# Patient Record
Sex: Male | Born: 1960 | Race: White | Hispanic: No | Marital: Single | Smoking: Current every day smoker
Health system: Southern US, Community
[De-identification: ages and names within clinical notes are randomized; demographics above are authoritative.]

## PROBLEM LIST (undated history)

## (undated) DIAGNOSIS — G709 Myoneural disorder, unspecified: Secondary | ICD-10-CM

## (undated) DIAGNOSIS — Z87442 Personal history of urinary calculi: Secondary | ICD-10-CM

## (undated) DIAGNOSIS — B1910 Unspecified viral hepatitis B without hepatic coma: Secondary | ICD-10-CM

## (undated) HISTORY — PX: TONSILLECTOMY: SUR1361

## (undated) HISTORY — PX: COLONOSCOPY: SHX174

## (undated) HISTORY — DX: Unspecified viral hepatitis B without hepatic coma: B19.10

## (undated) HISTORY — PX: TYMPANOSTOMY TUBE PLACEMENT: SHX32

## (undated) HISTORY — PX: TONSILLECTOMY AND ADENOIDECTOMY: SUR1326

---
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
## 2003-11-01 DIAGNOSIS — B1910 Unspecified viral hepatitis B without hepatic coma: Secondary | ICD-10-CM

## 2003-11-01 HISTORY — DX: Unspecified viral hepatitis B without hepatic coma: B19.10

## 2007-04-13 ENCOUNTER — Emergency Department (HOSPITAL_COMMUNITY): Admission: EM | Admit: 2007-04-13 | Discharge: 2007-04-14 | Payer: Self-pay | Admitting: Emergency Medicine

## 2008-04-25 IMAGING — CT CT ABDOMEN W/O CM
2 of 4 series · 17 of 46 positions shown, 19 images · non-contrast
Comparison: none

CLINICAL DATA: Left flank pain

ABDOMEN CT WITHOUT CONTRAST - URINARY STONE PROTOCOL
TECHNIQUE: Multidetector CT imaging of the abdomen was performed following the
urinary stone protocol.  No oral or intravenous contrast was administered.
TECHNIQUE: Multidetector CT imaging of the pelvis was performed following the

[Series 2: abd/pelv w/o 5.0 b31f st · axial · non-contrast · 0.66mm/px · z∈[-432,-32]mm · 14 of 88 slices shown, 16 images]
[im 4/88  soft-tissue]
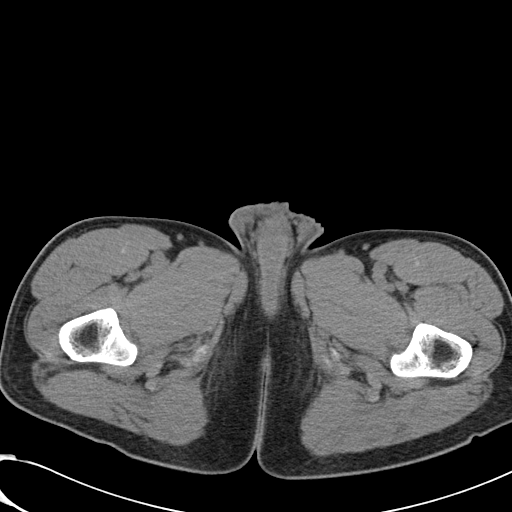
[im 4/88  bone]
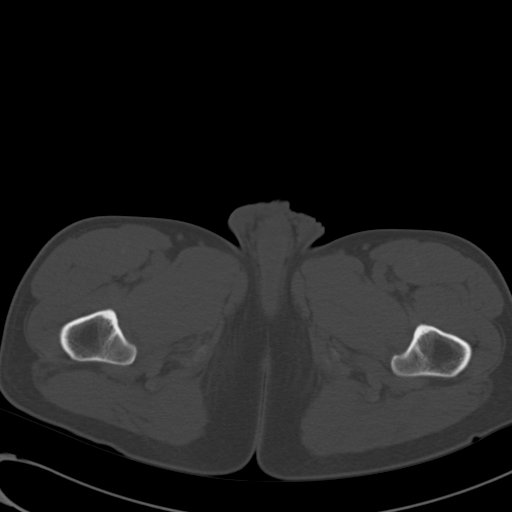
[im 11/88  soft-tissue]
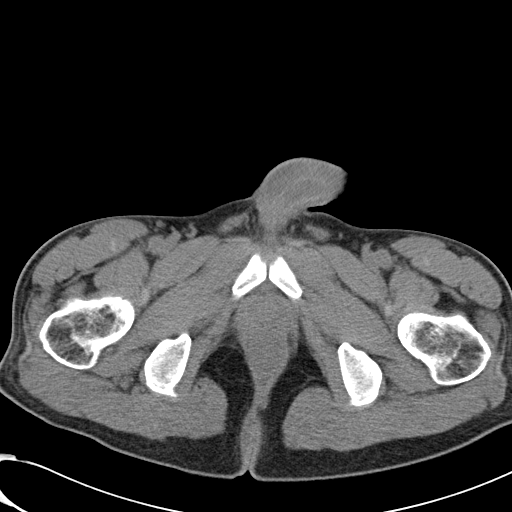
[im 19/88  soft-tissue]
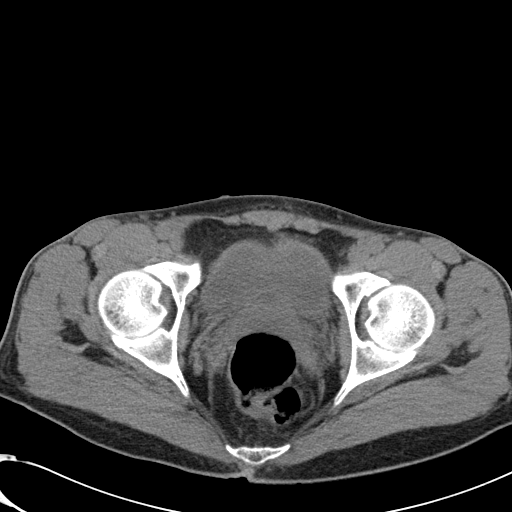
[im 22/88  soft-tissue]
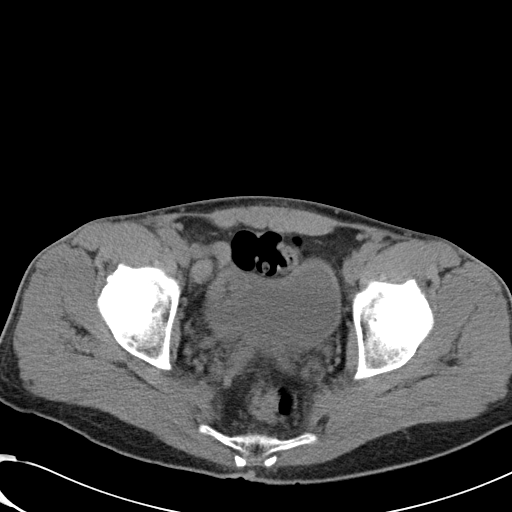
[im 30/88  soft-tissue]
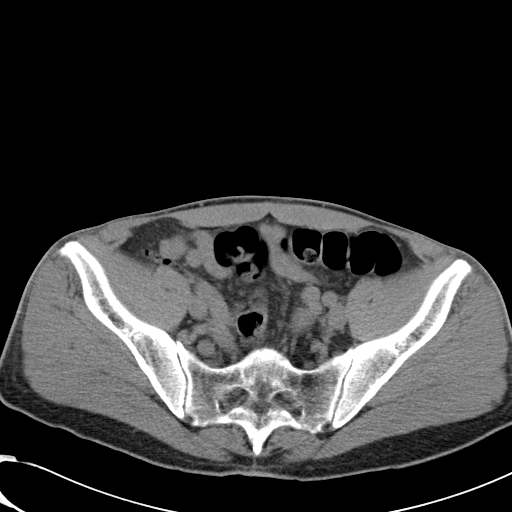
[im 37/88  soft-tissue]
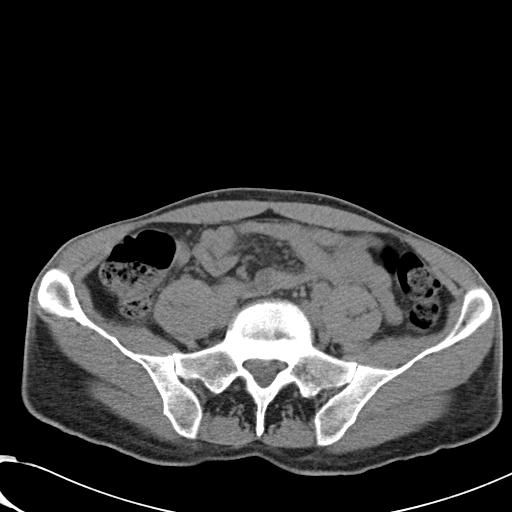
[im 40/88  soft-tissue]
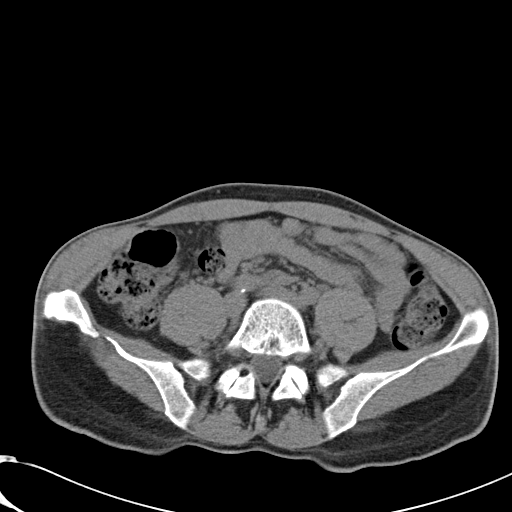
[im 48/88  soft-tissue]
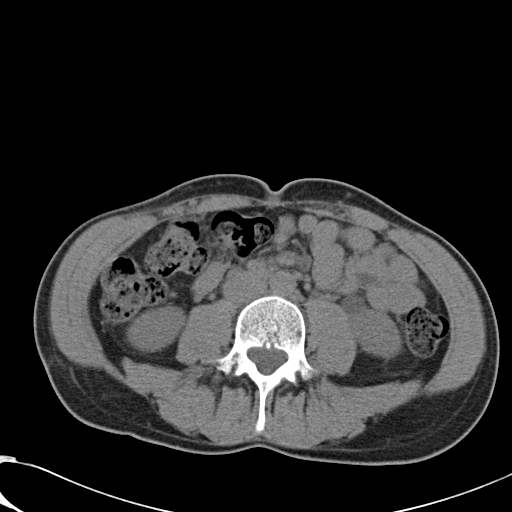
[im 51/88  soft-tissue]
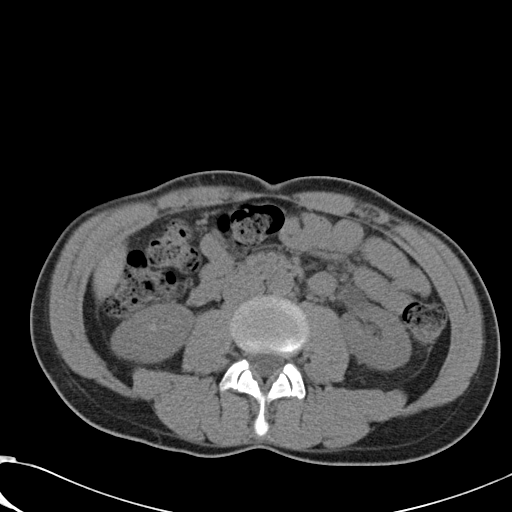
[im 51/88  bone]
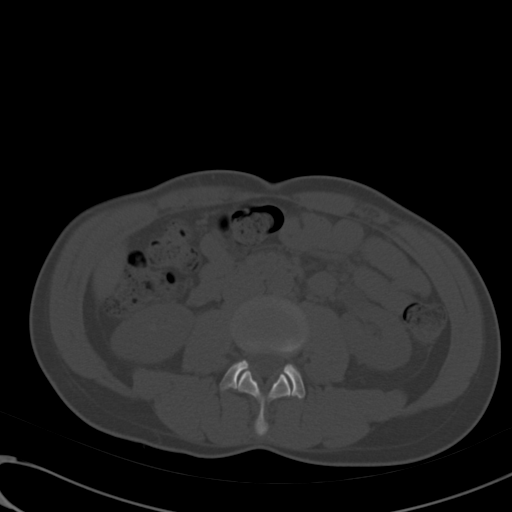
[im 59/88  soft-tissue]
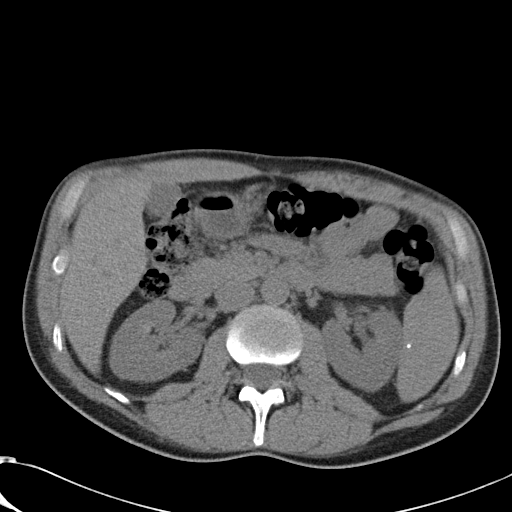
[im 66/88  soft-tissue]
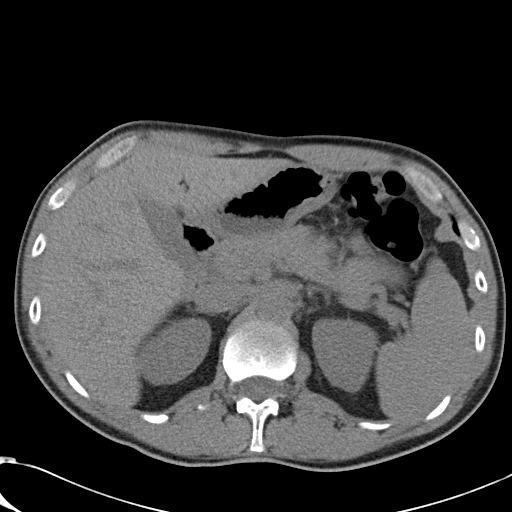
[im 69/88  soft-tissue]
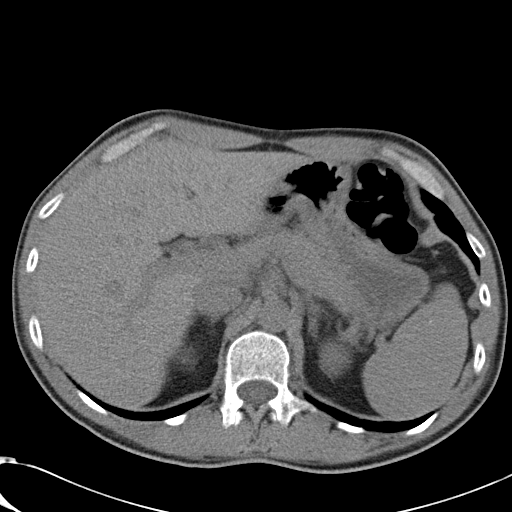
[im 77/88  soft-tissue]
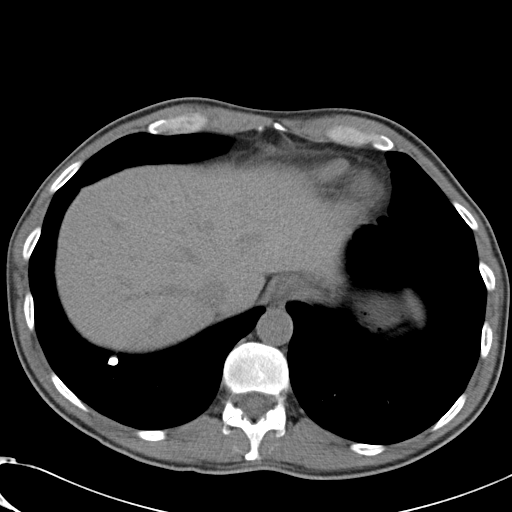
[im 84/88  soft-tissue]
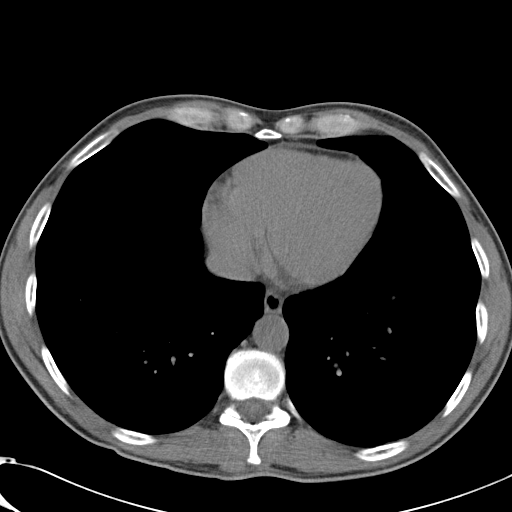

[Series 602: cor · coronal · 0.86mm/px · 3 of 89 slices shown]
[im 30/89  soft-tissue]
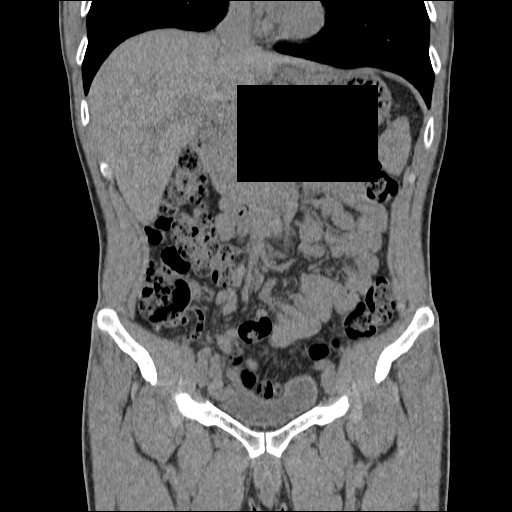
[im 40/89  soft-tissue]
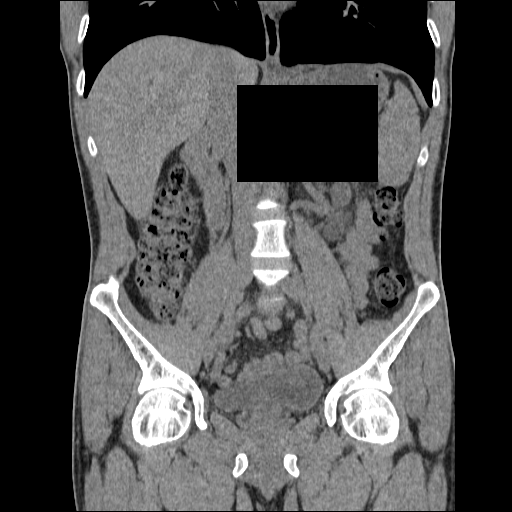
[im 49/89  soft-tissue]
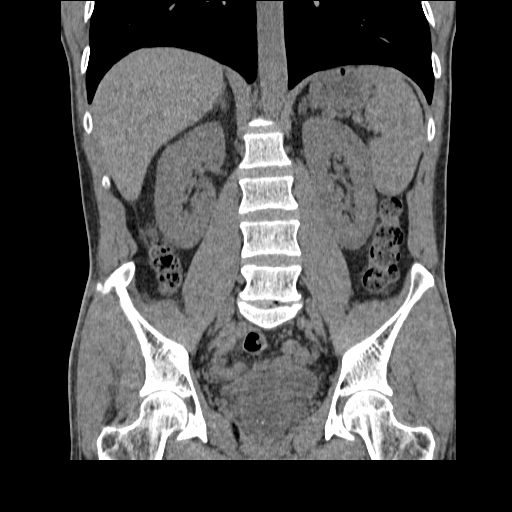

[17 of 46 positions shown; findings below may reference images not displayed]

FINDINGS: There is mild left hydronephrosis. 3 mm left UPJ stone present. No
additional stones. No hydronephrosis on the right. Small calcification is noted
in the spleen compatible with old granulomatous disease. Solid organs otherwise
have an unremarkable unenhanced appearance. Gallbladder and bowel grossly
unremarkable. Lung bases are clear except for calcified granuloma in the right
lung base.

IMPRESSION

3 mm left UPJ stone with mild left hydronephrosis.

PELVIS CT WITHOUT CONTRAST - URINARY STONE PROTOCOL
FINDINGS: No additional ureteral stones. Ureters are decompressed. Bladder
unremarkable. Mildly prominent prostate with calcifications noted. Appendix is
visualized and is normal. Bowel grossly unremarkable. No acute bony abnormality.

IMPRESSION

No acute findings in the pelvis.

Mildly prominent prostate.

## 2010-11-17 ENCOUNTER — Encounter: Payer: Self-pay | Admitting: Internal Medicine

## 2010-11-17 ENCOUNTER — Ambulatory Visit
Admission: RE | Admit: 2010-11-17 | Discharge: 2010-11-17 | Payer: Self-pay | Source: Home / Self Care | Attending: Internal Medicine | Admitting: Internal Medicine

## 2010-11-17 ENCOUNTER — Other Ambulatory Visit: Payer: Self-pay | Admitting: Internal Medicine

## 2010-11-17 DIAGNOSIS — F17200 Nicotine dependence, unspecified, uncomplicated: Secondary | ICD-10-CM | POA: Insufficient documentation

## 2010-11-17 DIAGNOSIS — B1910 Unspecified viral hepatitis B without hepatic coma: Secondary | ICD-10-CM | POA: Insufficient documentation

## 2010-11-17 LAB — CBC WITH DIFFERENTIAL/PLATELET
Basophils Absolute: 0 10*3/uL (ref 0.0–0.1)
Basophils Relative: 0.4 % (ref 0.0–3.0)
Eosinophils Absolute: 0.2 10*3/uL (ref 0.0–0.7)
Eosinophils Relative: 2.5 % (ref 0.0–5.0)
HCT: 46.1 % (ref 39.0–52.0)
Hemoglobin: 15.7 g/dL (ref 13.0–17.0)
Lymphocytes Relative: 36.7 % (ref 12.0–46.0)
Lymphs Abs: 2.9 10*3/uL (ref 0.7–4.0)
MCHC: 34.1 g/dL (ref 30.0–36.0)
MCV: 91.8 fl (ref 78.0–100.0)
Monocytes Absolute: 0.6 10*3/uL (ref 0.1–1.0)
Monocytes Relative: 7.5 % (ref 3.0–12.0)
Neutro Abs: 4.1 10*3/uL (ref 1.4–7.7)
Neutrophils Relative %: 52.9 % (ref 43.0–77.0)
Platelets: 228 10*3/uL (ref 150.0–400.0)
RBC: 5.02 Mil/uL (ref 4.22–5.81)
RDW: 12.8 % (ref 11.5–14.6)
WBC: 7.8 10*3/uL (ref 4.5–10.5)

## 2010-11-17 LAB — HEPATIC FUNCTION PANEL
ALT: 17 U/L (ref 0–53)
AST: 22 U/L (ref 0–37)
Albumin: 4.4 g/dL (ref 3.5–5.2)
Alkaline Phosphatase: 88 U/L (ref 39–117)
Bilirubin, Direct: 0.2 mg/dL (ref 0.0–0.3)
Total Bilirubin: 1 mg/dL (ref 0.3–1.2)
Total Protein: 7 g/dL (ref 6.0–8.3)

## 2010-11-17 LAB — BASIC METABOLIC PANEL
BUN: 13 mg/dL (ref 6–23)
CO2: 29 mEq/L (ref 19–32)
Calcium: 10 mg/dL (ref 8.4–10.5)
Chloride: 105 mEq/L (ref 96–112)
Creatinine, Ser: 0.9 mg/dL (ref 0.4–1.5)
GFR: 98.04 mL/min (ref >60.00)
Glucose, Bld: 81 mg/dL (ref 70–99)
Potassium: 3.9 mEq/L (ref 3.5–5.1)
Sodium: 141 mEq/L (ref 135–145)

## 2010-11-17 LAB — CONVERTED CEMR LAB
Hep A Total Ab: NEGATIVE
Hepatitis B Surface Ag: NEGATIVE

## 2010-11-17 LAB — LIPID PANEL
Cholesterol: 190 mg/dL (ref 0–200)
HDL: 38.8 mg/dL — ABNORMAL LOW (ref >39.00)
LDL Cholesterol: 135 mg/dL — ABNORMAL HIGH (ref 0–99)
Total CHOL/HDL Ratio: 5
Triglycerides: 79 mg/dL (ref 0.0–149.0)
VLDL: 15.8 mg/dL (ref 0.0–40.0)

## 2010-11-17 LAB — PSA: PSA: 0.42 ng/mL (ref 0.10–4.00)

## 2010-11-17 LAB — TSH: TSH: 1.62 u[IU]/mL (ref 0.35–5.50)

## 2010-12-02 NOTE — Letter (Signed)
Summary: Lipid Letter  [facility]  [address] [place], [place] [address]   Phone: [phone]  Fax: [phone]    11/17/2010  [facility] [address] [place], [place]  [address]  Dear Daniel Wells:  We have carefully reviewed your last lipid profile from 11/17/2010 and the results are noted below with a summary of recommendations for lipid management.    Cholesterol:       190     Goal: <200   HDL "good" Cholesterol:   41.32     Goal: >40   LDL "bad" Cholesterol:   135     Goal: <130   Triglycerides:       79.0     Goal: <150    all of your labs look good    TLC Diet (Therapeutic Lifestyle Change): Saturated Fats & Transfatty acids should be kept < 7% of total calories ***Reduce Saturated Fats Polyunstaurated Fat can be up to 10% of total calories Monounsaturated Fat Fat can be up to 20% of total calories Total Fat should be no greater than 25-35% of total calories Carbohydrates should be 50-60% of total calories Protein should be approximately 15% of total calories Fiber should be at least 20-30 grams a day ***Increased fiber may help lower LDL Total Cholesterol should be < 200mg /day Consider adding plant stanol/sterols to diet (example: Benacol spread) ***A higher intake of unsaturated fat may reduce Triglycerides and Increase HDL    Adjunctive Measures (may lower LIPIDS and reduce risk of Heart Attack) include: Aerobic Exercise (20-30 minutes 3-4 times a week) Limit Alcohol Consumption Weight Reduction Aspirin 75-81 mg a day by mouth (if not allergic or contraindicated) Dietary Fiber 20-30 grams a day by mouth     Current Medications:  None If you have any questions, please call. We appreciate being able to work with you.   Sincerely,    [facility] Etta Grandchild MD

## 2010-12-02 NOTE — Assessment & Plan Note (Signed)
Summary: NEW PT CPX/UMR/#/LB   Vital Signs:  Patient profile:   50 year old male Height:      70 inches Weight:      147 pounds BMI:     21.17 O2 Sat:      97 % on Room air Temp:     98.1 degrees F oral Pulse rate:   61 / minute Pulse rhythm:   regular Resp:     16 per minute BP sitting:   138 / 80  (left arm) Cuff size:   large  Vitals Entered By: Rock Nephew CMA (November 17, 2010 10:47 AM)  O2 Flow:  Room air CC: new to establish, Preventive Care Is Patient Diabetic? No Pain Assessment Patient in pain? no       Does patient need assistance? Functional Status Self care Ambulation Normal   Primary Care Provider:  Etta Grandchild MD  CC:  new to establish and Preventive Care.  History of Present Illness: New to me for a complete physical. He fells well today with no complaints.  Preventive Screening-Counseling & Management  Alcohol-Tobacco     Alcohol drinks/day: 0     Alcohol Counseling: not indicated; patient does not drink     Smoking Status: current     Smoking Cessation Counseling: yes     Smoke Cessation Stage: precontemplative     Packs/Day: 1.5     Year Started: 1976     Pack years: 24     Tobacco Counseling: to quit use of tobacco products  Caffeine-Diet-Exercise     Does Patient Exercise: no  Hep-HIV-STD-Contraception     Hepatitis Risk: no risk noted     HIV Risk: no risk noted     STD Risk: no risk noted     Dental Visit-last 6 months no     Dental Care Counseling: to seek dental care; no dental care within six months     TSE monthly: yes     Testicular SE Education/Counseling to perform regular STE     Sun Exposure-Excessive: no     Sun Exposure Counseling: not indicated; sun exposure is acceptable  Safety-Violence-Falls     Seat Belt Use: yes     Helmet Use: n/a     Firearms in the Home: no firearms in the home     Smoke Detectors: yes     Violence in the Home: no risk noted      Sexual History:  not active.        Drug Use:   no.        Blood Transfusions:  no.    Current Medications (verified): 1)  None  Allergies (verified): No Known Drug Allergies  Past History:  Past Medical History: Hepatitis B in 2005 after living in the [place] Right hip fracture in 2002 after a fall  Past Surgical History: Tonsillectomy  Family History: adopted  Social History: Occupation: Curator Divorced Current Smoker Alcohol use-no Drug use-no Regular exercise-no Smoking Status:  current Drug Use:  no Does Patient Exercise:  no Packs/Day:  1.5 Hepatitis Risk:  no risk noted HIV Risk:  no risk noted STD Risk:  no risk noted Dental Care w/in 6 mos.:  no Sun Exposure-Excessive:  no Seat Belt Use:  yes Blood Transfusions:  no Sexual History:  not active  Review of Systems  The patient denies anorexia, fever, weight loss, weight gain, hoarseness, chest pain, syncope, dyspnea on exertion, peripheral edema, prolonged cough, headaches, hemoptysis, abdominal pain,  melena, hematochezia, severe indigestion/heartburn, hematuria, suspicious skin lesions, transient blindness, difficulty walking, depression, enlarged lymph nodes, angioedema, and testicular masses.   GI:  Denies abdominal pain, change in bowel habits, constipation, diarrhea, indigestion, loss of appetite, nausea, vomiting, vomiting blood, and yellowish skin color.  Physical Exam  General:  alert, well-developed, well-nourished, well-hydrated, appropriate dress, cooperative to examination, good hygiene, and underweight appearing.   Head:  normocephalic and atraumatic.   Eyes:  No corneal or conjunctival inflammation noted. EOMI. Perrla. Funduscopic exam benign, without hemorrhages, exudates or papilledema. Vision grossly normal. no icterus. Ears:  R ear normal and L ear normal.   Nose:  External nasal examination shows no deformity or inflammation. Nasal mucosa are pink and moist without lesions or exudates. Mouth:  pharynx pink and moist, no erythema,  no exudates, no posterior lymphoid hypertrophy, no postnasal drip, no pharyngeal crowing, no lesions, no aphthous ulcers, no erosions, no tongue abnormalities, no leukoplakia, no petechiae, poor dentition, teeth missing, and excessive plaque.   Neck:  supple, full ROM, no masses, no thyromegaly, no thyroid nodules or tenderness, no JVD, no HJR, normal carotid upstroke, and no carotid bruits.   Chest Wall:  No deformities, masses, tenderness or gynecomastia noted. Breasts:  No masses or gynecomastia noted Lungs:  Normal respiratory effort, chest expands symmetrically. Lungs are clear to auscultation, no crackles or wheezes. Heart:  Normal rate and regular rhythm. S1 and S2 normal without gallop, murmur, click, rub or other extra sounds. Abdomen:  soft, non-tender, normal bowel sounds, no distention, no masses, no guarding, no rigidity, no rebound tenderness, no abdominal hernia, no inguinal hernia, no hepatomegaly, and no splenomegaly.   Rectal:  No external abnormalities noted. Normal sphincter tone. No rectal masses or tenderness. Genitalia:  circumcised, no hydrocele, no varicocele, no scrotal masses, no testicular masses or atrophy, no cutaneous lesions, and no urethral discharge.   Prostate:  Prostate gland firm and smooth, no enlargement, nodularity, tenderness, mass, asymmetry or induration. Msk:  No deformity or scoliosis noted of thoracic or lumbar spine.   Pulses:  R and L carotid,radial,femoral,dorsalis pedis and posterior tibial pulses are full and equal bilaterally Extremities:  No clubbing, cyanosis, edema, or deformity noted with normal full range of motion of all joints.   Neurologic:  No cranial nerve deficits noted. Station and gait are normal. Plantar reflexes are down-going bilaterally. DTRs are symmetrical throughout. Sensory, motor and coordinative functions appear intact. Skin:  skin is dry with a rare patch of eczema but no abnormal lesions Cervical Nodes:  no anterior cervical  adenopathy and no posterior cervical adenopathy.   Axillary Nodes:  no R axillary adenopathy and no L axillary adenopathy.   Inguinal Nodes:  no R inguinal adenopathy and no L inguinal adenopathy.   Psych:  Cognition and judgment appear intact. Alert and cooperative with normal attention span and concentration. No apparent delusions, illusions, hallucinations   Impression & Recommendations:  Problem # 1:  HEPATITIS B (ICD-070.30) Assessment New  Orders: Venipuncture ([address]) TLB-Lipid Panel (80061-LIPID) TLB-BMP (Basic Metabolic Panel-BMET) (80048-METABOL) TLB-CBC Platelet - w/Differential (85025-CBCD) TLB-Hepatic/Liver Function Pnl (80076-HEPATIC) TLB-TSH (Thyroid Stimulating Hormone) (84443-TSH) TLB-PSA (Prostate Specific Antigen) (84153-PSA) T-Hepatitis C Antibody (60454-09811) T-Hepatitis B Surface Antigen (91478-29562) T-Hepatitis A Antibody (13086-57846)  Problem # 2:  ROUTINE GENERAL MEDICAL EXAM@HEALTH  CARE FACL (ICD-V70.0)  Orders: Venipuncture ([address]) TLB-Lipid Panel (80061-LIPID) TLB-BMP (Basic Metabolic Panel-BMET) (80048-METABOL) TLB-CBC Platelet - w/Differential (85025-CBCD) TLB-Hepatic/Liver Function Pnl (80076-HEPATIC) TLB-TSH (Thyroid Stimulating Hormone) (84443-TSH) TLB-PSA (Prostate Specific Antigen) (84153-PSA) T-Hepatitis C Antibody (28413-24401)  T-Hepatitis B Surface Antigen ([phone]) T-Hepatitis A Antibody (14782-95621) Gastroenterology Referral (GI) Hemoccult Guaiac-1 spec.(in office) [phone])  Discussed using sunscreen, use of alcohol, drug use, self testicular exam, routine dental care, routine eye care, routine physical exam, seat belts, multiple vitamins, osteoporosis prevention, adequate calcium intake in diet, and recommendations for immunizations.  Discussed exercise and checking cholesterol.  Discussed gun safety, safe sex, and contraception. Also recommend checking PSA.  Problem # 3:  TOBACCO USE (ICD-305.1) Assessment:  Unchanged  Encouraged smoking cessation and discussed different methods for smoking cessation.   Colorectal Screening:  Current Recommendations:    Hemoccult: NEG X 1 today    Colonoscopy recommended: scheduled with G.I.  PSA Screening:    Reviewed PSA screening recommendations: PSA ordered  Patient Instructions: 1)  Please schedule a follow-up appointment as needed. 2)  Tobacco is very bad for your health and your loved ones! You Should stop smoking!. 3)  Stop Smoking Tips: Choose a Quit date. Cut down before the Quit date. decide what you will do as a substitute when you feel the urge to smoke(gum,toothpick,exercise). 4)  Schedule a colonoscopy/sigmoidoscopy to help detect colon cancer. 5)  If you could be exposed to sexually transmitted diseases, you should use a condom.   Orders Added: 1)  Venipuncture [36415] 2)  TLB-Lipid Panel [80061-LIPID] 3)  TLB-BMP (Basic Metabolic Panel-BMET) [80048-METABOL] 4)  TLB-CBC Platelet - w/Differential [85025-CBCD] 5)  TLB-Hepatic/Liver Function Pnl [80076-HEPATIC] 6)  TLB-TSH (Thyroid Stimulating Hormone) [84443-TSH] 7)  TLB-PSA (Prostate Specific Antigen) [78469-GEX] 8)  T-Hepatitis C Antibody [52841-32440] 9)  T-Hepatitis B Surface Antigen [10272-53664] 10)  T-Hepatitis A Antibody [40347-42595] 11)  Gastroenterology Referral [GI] 12)  Hemoccult Guaiac-1 spec.(in office) [82270] 13)  New Patient 40-64 years [99386]    Prevention & Chronic Care Immunizations   Influenza vaccine: Not documented   Influenza vaccine deferral: Refused  (11/17/2010)    Tetanus booster: Not documented   Td booster deferral: Refused  (11/17/2010)    Pneumococcal vaccine: Not documented  Colorectal Screening   Hemoccult: Not documented   Hemoccult action/deferral: NEG X 1 today  (11/17/2010)    Colonoscopy: Not documented   Colonoscopy action/deferral: scheduled with G.I.  (11/17/2010)  Other Screening   PSA: Not documented   PSA ordered.   PSA  action/deferral: PSA ordered  (11/17/2010)   Smoking status: current  (11/17/2010)   Smoking cessation counseling: yes  (11/17/2010)  Lipids   Total Cholesterol: Not documented   LDL: Not documented   LDL Direct: Not documented   HDL: Not documented   Triglycerides: Not documented

## 2010-12-10 ENCOUNTER — Encounter (INDEPENDENT_AMBULATORY_CARE_PROVIDER_SITE_OTHER): Payer: Self-pay | Admitting: *Deleted

## 2010-12-16 NOTE — Letter (Signed)
Summary: Pre Visit Letter Revised  [facility]  [address] [place], [place] [address]   Phone: [phone]  Fax: [phone]        12/10/2010 MRN: [id number] [name] [address] [place], [place]  [address]             Procedure Date:  01/05/2011 @ 10:30   Direct colon-Dr. Marina Goodell   Welcome to the Gastroenterology Division at [facility].    You are scheduled to see a nurse for your pre-procedure visit on 12/22/2010 at 8:30 on the 3rd floor at [facility], [address]  We ask that you try to arrive at our office 15 minutes prior to your appointment time to allow for check-in.  Please take a minute to review the attached form.  If you answer "Yes" to one or more of the questions on the first page, we ask that you call the person listed at your earliest opportunity.  If you answer "No" to all of the questions, please complete the rest of the form and bring it to your appointment.    Your nurse visit will consist of discussing your medical and surgical history, your immediate family medical history, and your medications.   If you are unable to list all of your medications on the form, please bring the medication bottles to your appointment and we will list them.  We will need to be aware of both prescribed and over the counter drugs.  We will need to know exact dosage information as well.    Please be prepared to read and sign documents such as consent forms, a financial agreement, and acknowledgement forms.  If necessary, and with your consent, a friend or relative is welcome to sit-in on the nurse visit with you.  Please bring your insurance card so that we may make a copy of it.  If your insurance requires a referral to see a specialist, please bring your referral form from your primary care physician.  No co-pay is required for this nurse visit.     If you cannot keep your appointment, please call [phone] to cancel or reschedule prior to your  appointment date.  This allows [place] the opportunity to schedule an appointment for another patient in need of care.    Thank you for choosing [facility] for your medical needs.  We appreciate the opportunity to care for you.  Please visit [place] at our website  to learn more about our practice.  Sincerely, The Gastroenterology Division

## 2010-12-21 ENCOUNTER — Encounter (INDEPENDENT_AMBULATORY_CARE_PROVIDER_SITE_OTHER): Payer: Self-pay | Admitting: *Deleted

## 2010-12-22 ENCOUNTER — Encounter: Payer: Self-pay | Admitting: Internal Medicine

## 2010-12-28 NOTE — Miscellaneous (Signed)
Summary: [facility] Previsit/prep  Clinical Lists Changes  Medications: Added new medication of MOVIPREP 100 GM  SOLR (PEG-KCL-NACL-NASULF-NA ASC-C) As per prep instructions. - Signed Rx of MOVIPREP 100 GM  SOLR (PEG-KCL-NACL-NASULF-NA ASC-C) As per prep instructions.;  #1 x 0;  Signed;  Entered by: [name] RN;  Authorized by: Hilarie Fredrickson MD;  Method used: Electronically to [facility] [phone]*, [address] [place], [place]  [address], Ph: [phone] or [phone], Fax: [phone] Observations: Added new observation of NKA: T (12/22/2010 7:55)    Prescriptions: MOVIPREP 100 GM  SOLR (PEG-KCL-NACL-NASULF-NA ASC-C) As per prep instructions.  #1 x 0   Entered by:   [name] RN   Authorized by:   Hilarie Fredrickson MD   Signed by:   [name] RN on 12/22/2010   Method used:   Electronically to        [facility]. [phone]* (retail)       [address]       [place], [place]  [address]       Ph: [phone] or [phone]       Fax: [phone]   RxID:   [id number]

## 2010-12-28 NOTE — Letter (Signed)
Summary: [facility] Instructions  [facility]  [address] [place], [place] [address]   Phone: [phone]  Fax: [phone]       Daniel Wells    [birth year]    MRN: [id number]        Procedure Day Dorna Bloom:  [facility]  01/05/11     Arrival Time:  9:30AM     Procedure Time:  10:30AM     Location of Procedure:                    _ X_  [facility] (4th Floor)                      PREPARATION FOR COLONOSCOPY WITH MOVIPREP   Starting 5 days prior to your procedure 12/31/10 do not eat nuts, seeds, popcorn, corn, beans, peas,  salads, or any raw vegetables.  Do not take any fiber supplements (e.g. Metamucil, Citrucel, and Benefiber).  THE DAY BEFORE YOUR PROCEDURE         DATE: 01/04/11  DAY: TUESDAY  1.  Drink clear liquids the entire day-NO SOLID FOOD  2.  Do not drink anything colored red or purple.  Avoid juices with pulp.  No orange juice.  3.  Drink at least 64 oz. (8 glasses) of fluid/clear liquids during the day to prevent dehydration and help the prep work efficiently.  CLEAR LIQUIDS INCLUDE: Water Jello Ice Popsicles Tea (sugar ok, no milk/cream) Powdered fruit flavored drinks Coffee (sugar ok, no milk/cream) Gatorade Juice: apple, white grape, white cranberry  Lemonade Clear bullion, consomm, broth Carbonated beverages (any kind) Strained chicken noodle soup Hard Candy                             4.  In the morning, mix first dose of MoviPrep solution:    Empty 1 Pouch A and 1 Pouch B into the disposable container    Add lukewarm drinking water to the top line of the container. Mix to dissolve    Refrigerate (mixed solution should be used within 24 hrs)  5.  Begin drinking the prep at 5:00 p.m. The MoviPrep container is divided by 4 marks.   Every 15 minutes drink the solution down to the next mark (approximately 8 oz) until the full liter is complete.   6.  Follow completed prep with 16 oz of clear liquid of your choice  (Nothing red or purple).  Continue to drink clear liquids until bedtime.  7.  Before going to bed, mix second dose of MoviPrep solution:    Empty 1 Pouch A and 1 Pouch B into the disposable container    Add lukewarm drinking water to the top line of the container. Mix to dissolve    Refrigerate  THE DAY OF YOUR PROCEDURE      DATE: 01/05/11   DAY: WEDNESDAY  Beginning at 5:30AM (5 hours before procedure):         1. Every 15 minutes, drink the solution down to the next mark (approx 8 oz) until the full liter is complete.  2. Follow completed prep with 16 oz. of clear liquid of your choice.    3. You may drink clear liquids until 8:30AM (2 HOURS BEFORE PROCEDURE).   MEDICATION INSTRUCTIONS  Unless otherwise instructed, you should take regular prescription medications with a small sip of water   as early as possible the morning of  your procedure.         OTHER INSTRUCTIONS  You will need a responsible adult at least 52 years of age to accompany you and drive you home.   This person must remain in the waiting room during your procedure.  Wear loose fitting clothing that is easily removed.  Leave jewelry and other valuables at home.  However, you may wish to bring a book to read or  an iPod/MP3 player to listen to music as you wait for your procedure to start.  Remove all body piercing jewelry and leave at home.  Total time from sign-in until discharge is approximately 2-3 hours.  You should go home directly after your procedure and rest.  You can resume normal activities the  day after your procedure.  The day of your procedure you should not:   Drive   Make legal decisions   Operate machinery   Drink alcohol   Return to work  You will receive specific instructions about eating, activities and medications before you leave.    The above instructions have been reviewed and explained to me by   [name] RN  December 22, 2010 8:21 AM     I fully  understand and can verbalize these instructions _____________________________ Date _________

## 2011-01-04 ENCOUNTER — Telehealth: Payer: Self-pay | Admitting: Internal Medicine

## 2011-01-05 ENCOUNTER — Other Ambulatory Visit: Payer: Self-pay | Admitting: Internal Medicine

## 2011-01-11 NOTE — Progress Notes (Signed)
Summary: cx proc for 3-7  Phone Note Call from Patient   Caller: Patient Call For: Dr Marina Goodell Summary of Call: Patient called and rescheduled his procedure for tomorrow to 4-20 because his relative had renal failure and he is taking care of him. patient had called last night and left a message on Ashley's voicemail.  will you charge? Initial call taken by: Tawni Levy,  January 04, 2011 8:10 AM  Follow-up for Phone Call         no charge Follow-up by: Hilarie Fredrickson MD,  January 04, 2011 8:56 AM

## 2011-02-17 ENCOUNTER — Encounter: Payer: Self-pay | Admitting: Internal Medicine

## 2011-02-18 ENCOUNTER — Ambulatory Visit: Payer: Commercial Managed Care - PPO | Admitting: Internal Medicine

## 2011-02-18 ENCOUNTER — Encounter: Payer: Self-pay | Admitting: Internal Medicine

## 2011-02-18 VITALS — BP 136/88 | HR 68 | Temp 96.9°F | Resp 20 | Ht 70.0 in | Wt 150.0 lb

## 2011-02-18 DIAGNOSIS — D126 Benign neoplasm of colon, unspecified: Secondary | ICD-10-CM

## 2011-02-18 DIAGNOSIS — Z1211 Encounter for screening for malignant neoplasm of colon: Secondary | ICD-10-CM

## 2011-02-18 DIAGNOSIS — K621 Rectal polyp: Secondary | ICD-10-CM

## 2011-02-18 DIAGNOSIS — K648 Other hemorrhoids: Secondary | ICD-10-CM

## 2011-02-18 DIAGNOSIS — K620 Anal polyp: Secondary | ICD-10-CM

## 2011-02-18 MED ORDER — SODIUM CHLORIDE 0.9 % IV SOLN: 500.0000 mL | INTRAVENOUS | Status: DC

## 2011-02-18 NOTE — Progress Notes (Signed)
PT WANTS TO RECEIVE ALL DISCHARGE INSTRUCTIONS- REFUSES TO LET NURSE DISCUSS ANY INFO WITH CAREPARTNER. REPORT AND ALL INSTRUCTIONS SEALED IN ENVELOPE AND GIVEN TO PATIENT.  PT AWAKE ALERT ASKING APPROPRIATE QUESTIONS. DENIES PAIN OR DISCOMFORT

## 2011-02-21 ENCOUNTER — Telehealth: Payer: Self-pay

## 2011-02-21 NOTE — Telephone Encounter (Signed)

## 2011-08-18 LAB — URINALYSIS, ROUTINE W REFLEX MICROSCOPIC
Glucose, UA: NEGATIVE
Protein, ur: 100 — AB
Specific Gravity, Urine: 1.023
Urobilinogen, UA: 1

## 2011-08-18 LAB — I-STAT 8, (EC8 V) (CONVERTED LAB)
BUN: 7
Chloride: 107
HCT: 47
Potassium: 3.3 — ABNORMAL LOW
pCO2, Ven: 32.2 — ABNORMAL LOW
pH, Ven: 7.467 — ABNORMAL HIGH

## 2011-08-18 LAB — POCT I-STAT CREATININE: Creatinine, Ser: 0.8

## 2011-08-18 LAB — URINE MICROSCOPIC-ADD ON

## 2012-12-03 ENCOUNTER — Encounter: Payer: Self-pay | Admitting: Internal Medicine

## 2012-12-03 ENCOUNTER — Other Ambulatory Visit (INDEPENDENT_AMBULATORY_CARE_PROVIDER_SITE_OTHER): Payer: Commercial Managed Care - PPO

## 2012-12-03 ENCOUNTER — Ambulatory Visit (INDEPENDENT_AMBULATORY_CARE_PROVIDER_SITE_OTHER): Payer: Commercial Managed Care - PPO | Admitting: Internal Medicine

## 2012-12-03 VITALS — BP 130/82 | HR 72 | Temp 97.8°F | Resp 16 | Ht 70.0 in | Wt 154.8 lb

## 2012-12-03 DIAGNOSIS — R220 Localized swelling, mass and lump, head: Secondary | ICD-10-CM

## 2012-12-03 DIAGNOSIS — R221 Localized swelling, mass and lump, neck: Secondary | ICD-10-CM

## 2012-12-03 LAB — COMPREHENSIVE METABOLIC PANEL
ALT: 19 U/L (ref 0–53)
AST: 21 U/L (ref 0–37)
Albumin: 4.3 g/dL (ref 3.5–5.2)
Alkaline Phosphatase: 91 U/L (ref 39–117)
Calcium: 10.2 mg/dL (ref 8.4–10.5)
Chloride: 104 mEq/L (ref 96–112)
Potassium: 4.9 mEq/L (ref 3.5–5.1)
Sodium: 141 mEq/L (ref 135–145)
Total Protein: 7 g/dL (ref 6.0–8.3)

## 2012-12-03 LAB — CBC WITH DIFFERENTIAL/PLATELET
Eosinophils Absolute: 0.3 10*3/uL (ref 0.0–0.7)
Lymphocytes Relative: 33.8 % (ref 12.0–46.0)
MCHC: 34.5 g/dL (ref 30.0–36.0)
MCV: 89.2 fl (ref 78.0–100.0)
Monocytes Absolute: 0.8 10*3/uL (ref 0.1–1.0)
Neutrophils Relative %: 53 % (ref 43.0–77.0)
Platelets: 222 10*3/uL (ref 150.0–400.0)
WBC: 8.2 10*3/uL (ref 4.5–10.5)

## 2012-12-03 NOTE — Assessment & Plan Note (Signed)
I will check his labs today to look for infection, inflammation, malignancy He is high risk for head and neck cancer due to smoking history so I have ordered a CT scan of his neck to look for mass, LAD, infection

## 2012-12-03 NOTE — Progress Notes (Signed)
Subjective:    Patient ID: Daniel Wells, male    DOB: 28-Jan-1961, 52 y.o.   MRN: [id number]  Neck Pain  This is a new problem. Episode onset: 3 months ago. The problem occurs constantly. The problem has been gradually worsening. The pain is associated with nothing. The pain is present in the left side. The quality of the pain is described as aching. The pain is at a severity of 1/10. The pain is mild. The symptoms are aggravated by swallowing. The pain is same all the time. Associated symptoms include pain with swallowing. Pertinent negatives include no chest pain, fever, headaches, leg pain, numbness, paresis, photophobia, syncope, tingling, trouble swallowing, visual change, weakness or weight loss. He has tried nothing for the symptoms.      Review of Systems  Constitutional: Negative for fever, chills, weight loss, diaphoresis, activity change, fatigue and unexpected weight change.  HENT: Positive for congestion and neck pain. Negative for hearing loss, ear pain, nosebleeds, sore throat, facial swelling, rhinorrhea, sneezing, trouble swallowing, neck stiffness, voice change, postnasal drip, sinus pressure and tinnitus.   Eyes: Negative.  Negative for photophobia.  Respiratory: Negative for apnea, cough, choking, chest tightness, shortness of breath, wheezing and stridor.   Cardiovascular: Negative for chest pain, palpitations, leg swelling and syncope.  Gastrointestinal: Negative for nausea, vomiting, abdominal pain, diarrhea, constipation and blood in stool.  Genitourinary: Negative.   Musculoskeletal: Negative for myalgias, back pain, joint swelling, arthralgias and gait problem.  Skin: Negative for color change, pallor, rash and wound.  Neurological: Negative for dizziness, tingling, tremors, weakness, numbness and headaches.  Hematological: Positive for adenopathy (left side of his neck). Does not bruise/bleed easily.  Psychiatric/Behavioral: Negative.        Objective:   Physical Exam  Vitals reviewed. Constitutional: He is oriented to person, place, and time. He appears well-developed and well-nourished.  Non-toxic appearance. He does not have a sickly appearance. He does not appear ill. No distress.  HENT:  Head: No trismus in the jaw.  Right Ear: Hearing, tympanic membrane, external ear and ear canal normal.  Left Ear: Hearing, tympanic membrane, external ear and ear canal normal.  Nose: No mucosal edema, rhinorrhea or nasal deformity. No epistaxis. Right sinus exhibits no maxillary sinus tenderness and no frontal sinus tenderness. Left sinus exhibits no maxillary sinus tenderness and no frontal sinus tenderness.  Mouth/Throat: Oropharynx is clear and moist and mucous membranes are normal. Mucous membranes are not pale, not dry and not cyanotic. He does not have dentures. No oral lesions. Abnormal dentition. Dental caries present. No dental abscesses, uvula swelling or lacerations. No oropharyngeal exudate, posterior oropharyngeal edema, posterior oropharyngeal erythema or tonsillar abscesses.  Eyes: Conjunctivae normal are normal. Right eye exhibits no discharge. Left eye exhibits no discharge. No scleral icterus.  Neck: Trachea normal and normal range of motion. Neck supple. Normal carotid pulses, no hepatojugular reflux and no JVD present. No tracheal tenderness, no spinous process tenderness and no muscular tenderness present. Carotid bruit is not present. No rigidity. No tracheal deviation, no edema, no erythema and normal range of motion present. No Brudzinski's sign and no Kernig's sign noted. Mass present. No thyromegaly present.    Cardiovascular: Normal rate, regular rhythm, normal heart sounds and intact distal pulses.  Exam reveals no gallop and no friction rub.   No murmur heard. Pulmonary/Chest: Effort normal and breath sounds normal. No stridor. No respiratory distress. He has no wheezes. He has no rales. He exhibits no tenderness.  Abdominal: Soft.  Bowel  sounds are normal. He exhibits no distension and no mass. There is no tenderness. There is no rebound and no guarding.  Musculoskeletal: Normal range of motion. He exhibits no edema and no tenderness.  Lymphadenopathy:       Head (right side): No submental, no submandibular, no tonsillar, no preauricular, no posterior auricular and no occipital adenopathy present.       Head (left side): No submental, no submandibular, no tonsillar, no preauricular, no posterior auricular and no occipital adenopathy present.    He has cervical adenopathy.       Right cervical: No superficial cervical, no deep cervical and no posterior cervical adenopathy present.      Left cervical: Deep cervical adenopathy present. No superficial cervical and no posterior cervical adenopathy present.    He has no axillary adenopathy.       Right axillary: No pectoral and no lateral adenopathy present.       Left axillary: No pectoral and no lateral adenopathy present.      Right: No supraclavicular adenopathy present.       Left: No supraclavicular adenopathy present.  Neurological: He is oriented to person, place, and time.  Skin: Skin is warm and dry. No rash noted. He is not diaphoretic. No erythema. No pallor.  Psychiatric: He has a normal mood and affect. His behavior is normal. Judgment and thought content normal.      Lab Results  Component Value Date   WBC 7.8 11/17/2010   HGB 15.7 11/17/2010   HCT 46.1 11/17/2010   PLT 228.0 11/17/2010   GLUCOSE 81 11/17/2010   CHOL 190 11/17/2010   TRIG 79.0 11/17/2010   HDL 38.80* 11/17/2010   LDLCALC 135* 11/17/2010   ALT 17 11/17/2010   AST 22 11/17/2010   NA 141 11/17/2010   K 3.9 11/17/2010   CL 105 11/17/2010   CREATININE 0.9 11/17/2010   BUN 13 11/17/2010   CO2 29 11/17/2010   TSH 1.62 11/17/2010   PSA 0.42 11/17/2010      Assessment & Plan:

## 2012-12-03 NOTE — Patient Instructions (Signed)

## 2012-12-05 ENCOUNTER — Telehealth: Payer: Self-pay | Admitting: Internal Medicine

## 2012-12-05 NOTE — Telephone Encounter (Signed)
Pt  Canceled his  CT SOFT TISSUE NECK W CONTRAST pt was scheduled  12-05-2012 [facility]

## 2012-12-06 ENCOUNTER — Other Ambulatory Visit: Payer: Commercial Managed Care - PPO

## 2014-05-29 ENCOUNTER — Ambulatory Visit (INDEPENDENT_AMBULATORY_CARE_PROVIDER_SITE_OTHER): Payer: Commercial Managed Care - PPO | Admitting: Internal Medicine

## 2014-05-29 ENCOUNTER — Other Ambulatory Visit (INDEPENDENT_AMBULATORY_CARE_PROVIDER_SITE_OTHER): Payer: Commercial Managed Care - PPO

## 2014-05-29 ENCOUNTER — Other Ambulatory Visit: Payer: Commercial Managed Care - PPO

## 2014-05-29 ENCOUNTER — Encounter: Payer: Self-pay | Admitting: Internal Medicine

## 2014-05-29 VITALS — BP 146/96 | HR 58 | Temp 98.0°F | Wt 151.8 lb

## 2014-05-29 DIAGNOSIS — R319 Hematuria, unspecified: Secondary | ICD-10-CM

## 2014-05-29 DIAGNOSIS — R030 Elevated blood-pressure reading, without diagnosis of hypertension: Secondary | ICD-10-CM

## 2014-05-29 LAB — CBC WITH DIFFERENTIAL/PLATELET
BASOS ABS: 0.1 10*3/uL (ref 0.0–0.1)
Basophils Relative: 0.4 % (ref 0.0–3.0)
EOS ABS: 0.2 10*3/uL (ref 0.0–0.7)
Eosinophils Relative: 1.7 % (ref 0.0–5.0)
HCT: 47.3 % (ref 39.0–52.0)
Hemoglobin: 16.2 g/dL (ref 13.0–17.0)
LYMPHS PCT: 25 % (ref 12.0–46.0)
Lymphs Abs: 3 10*3/uL (ref 0.7–4.0)
MCHC: 34.3 g/dL (ref 30.0–36.0)
MCV: 91.2 fl (ref 78.0–100.0)
MONO ABS: 0.9 10*3/uL (ref 0.1–1.0)
Monocytes Relative: 7.8 % (ref 3.0–12.0)
NEUTROS ABS: 7.9 10*3/uL — AB (ref 1.4–7.7)
NEUTROS PCT: 65.1 % (ref 43.0–77.0)
Platelets: 229 10*3/uL (ref 150.0–400.0)
RBC: 5.18 Mil/uL (ref 4.22–5.81)
RDW: 12.8 % (ref 11.5–15.5)
WBC: 12.1 10*3/uL — ABNORMAL HIGH (ref 4.0–10.5)

## 2014-05-29 LAB — POCT URINALYSIS DIPSTICK
Bilirubin, UA: NEGATIVE
Glucose, UA: NEGATIVE
Ketones, UA: NEGATIVE
LEUKOCYTES UA: NEGATIVE
Nitrite, UA: NEGATIVE
PH UA: 6.5
PROTEIN UA: NEGATIVE
Spec Grav, UA: 1.015
UROBILINOGEN UA: 0.2

## 2014-05-29 NOTE — Progress Notes (Signed)
Pre visit review using our clinic review tool, if applicable. No additional management support is needed unless otherwise documented below in the visit note. 

## 2014-05-29 NOTE — Patient Instructions (Signed)
Your next office appointment will be determined based upon review of your pending labs. Those instructions will be transmitted to you through My Chart  OR  by mail;whichever process is your choice to receive results & recommendations .  Drink to thirst up to 40 ounces of water daily.  The Urology referral will be scheduled and you'll be notified of the time.

## 2014-05-29 NOTE — Progress Notes (Signed)
   Subjective:    Patient ID: Daniel Wells, male    DOB: 07-Aug-1961, 53 y.o.   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  This has persisted but gotten lighter in color. He is actually color blind and described the urine as dark  He does have a history of kidney stones but there was no associated flank or abdominal pain as he had with kidney stones in 2007.  He has no associated pyuria or dysuria      Review of Systems Epistaxis, hemoptysis, melena, or rectal bleeding denied. No unexplained weight loss, significant dyspepsia,dysphagia, or abdominal pain.  There is no abnormal bruising , bleeding, or difficulty stopping bleeding with injury.     Objective:   Physical Exam Significant or distinguishing  findings on physical exam include: He is thin but appears adequately nourished He has long hair and has goatee. He has severe caries with erosions to & below gum line Slight clubbing of the nailbeds is suggested. There are traumatic changes of nails There is a minor granuloma in the left epididymal area. This is nontender Prostate exam is totally normal.  Eyes: No conjunctival inflammation or scleral icterus is present. Oral exam: There is no oropharyngeal erythema or exudate noted.  Heart:  Normal rate and regular rhythm. S1 and S2 normal without gallop, murmur, click, rub or other extra sounds Repeat blood pressure is 130/88. Lungs:Chest clear to auscultation; no wheezes, rhonchi,rales ,or rubs present.No increased work of breathing.  Abdomen: bowel sounds normal, soft and non-tender without masses, organomegaly or hernias noted.  No guarding or rebound. No flank tenderness to percussion. Skin:Warm & dry.  Intact without suspicious lesions or rashes ; no jaundice or tenting Lymphatic: No lymphadenopathy is noted about the head, neck, axilla, or inguinal areas.              Assessment & Plan:  #1 gross hematuria, painless in the context of working in the heat  #2 past history of renal calculi  #3 elevated BP w/o dx of HTN  Plan: Urine culture will be collected. CBC and differential will be performed.  Urology consultation entered  Smoking risk and the risk of address caries were also discussed.

## 2014-05-30 LAB — URINE CULTURE
COLONY COUNT: NO GROWTH
Organism ID, Bacteria: NO GROWTH

## 2014-06-12 ENCOUNTER — Ambulatory Visit (INDEPENDENT_AMBULATORY_CARE_PROVIDER_SITE_OTHER): Payer: Commercial Managed Care - PPO | Admitting: Internal Medicine

## 2014-06-12 ENCOUNTER — Encounter: Payer: Self-pay | Admitting: Internal Medicine

## 2014-06-12 ENCOUNTER — Other Ambulatory Visit (INDEPENDENT_AMBULATORY_CARE_PROVIDER_SITE_OTHER): Payer: Commercial Managed Care - PPO

## 2014-06-12 VITALS — BP 140/82 | HR 84 | Temp 97.5°F | Resp 16 | Ht 70.0 in | Wt 155.0 lb

## 2014-06-12 DIAGNOSIS — Z0000 Encounter for general adult medical examination without abnormal findings: Secondary | ICD-10-CM | POA: Insufficient documentation

## 2014-06-12 DIAGNOSIS — F17200 Nicotine dependence, unspecified, uncomplicated: Secondary | ICD-10-CM

## 2014-06-12 DIAGNOSIS — B1910 Unspecified viral hepatitis B without hepatic coma: Secondary | ICD-10-CM

## 2014-06-12 DIAGNOSIS — R310 Gross hematuria: Secondary | ICD-10-CM | POA: Insufficient documentation

## 2014-06-12 LAB — URINALYSIS, ROUTINE W REFLEX MICROSCOPIC
Bilirubin Urine: NEGATIVE
Hgb urine dipstick: NEGATIVE
KETONES UR: NEGATIVE
LEUKOCYTES UA: NEGATIVE
Nitrite: NEGATIVE
PH: 6 (ref 5.0–8.0)
SPECIFIC GRAVITY, URINE: 1.01 (ref 1.000–1.030)
Total Protein, Urine: NEGATIVE
Urine Glucose: NEGATIVE
Urobilinogen, UA: 0.2 (ref 0.0–1.0)

## 2014-06-12 LAB — CBC WITH DIFFERENTIAL/PLATELET
BASOS PCT: 0.4 % (ref 0.0–3.0)
Basophils Absolute: 0 10*3/uL (ref 0.0–0.1)
EOS PCT: 2 % (ref 0.0–5.0)
Eosinophils Absolute: 0.2 10*3/uL (ref 0.0–0.7)
HCT: 44.5 % (ref 39.0–52.0)
Hemoglobin: 15.1 g/dL (ref 13.0–17.0)
Lymphocytes Relative: 32.2 % (ref 12.0–46.0)
Lymphs Abs: 2.8 10*3/uL (ref 0.7–4.0)
MCHC: 34 g/dL (ref 30.0–36.0)
MCV: 91 fl (ref 78.0–100.0)
MONO ABS: 0.5 10*3/uL (ref 0.1–1.0)
MONOS PCT: 6.4 % (ref 3.0–12.0)
NEUTROS PCT: 59 % (ref 43.0–77.0)
Neutro Abs: 5.1 10*3/uL (ref 1.4–7.7)
Platelets: 206 10*3/uL (ref 150.0–400.0)
RBC: 4.89 Mil/uL (ref 4.22–5.81)
RDW: 12.8 % (ref 11.5–15.5)
WBC: 8.6 10*3/uL (ref 4.0–10.5)

## 2014-06-12 LAB — COMPREHENSIVE METABOLIC PANEL
ALBUMIN: 4.1 g/dL (ref 3.5–5.2)
ALT: 13 U/L (ref 0–53)
AST: 19 U/L (ref 0–37)
Alkaline Phosphatase: 96 U/L (ref 39–117)
BILIRUBIN TOTAL: 0.9 mg/dL (ref 0.2–1.2)
BUN: 16 mg/dL (ref 6–23)
CO2: 29 meq/L (ref 19–32)
Calcium: 9.9 mg/dL (ref 8.4–10.5)
Chloride: 104 mEq/L (ref 96–112)
Creatinine, Ser: 0.9 mg/dL (ref 0.4–1.5)
GFR: 98.02 mL/min (ref >60.00)
GLUCOSE: 79 mg/dL (ref 70–99)
Potassium: 3.9 mEq/L (ref 3.5–5.1)
Sodium: 138 mEq/L (ref 135–145)
Total Protein: 6.8 g/dL (ref 6.0–8.3)

## 2014-06-12 LAB — LIPID PANEL
CHOLESTEROL: 190 mg/dL (ref 0–200)
HDL: 36.3 mg/dL — ABNORMAL LOW (ref >39.00)
LDL Cholesterol: 133 mg/dL — ABNORMAL HIGH (ref 0–99)
NonHDL: 153.7
TRIGLYCERIDES: 106 mg/dL (ref 0.0–149.0)
Total CHOL/HDL Ratio: 5
VLDL: 21.2 mg/dL (ref 0.0–40.0)

## 2014-06-12 LAB — TSH: TSH: 0.97 u[IU]/mL (ref 0.35–4.50)

## 2014-06-12 LAB — PSA: PSA: 0.38 ng/mL (ref 0.10–4.00)

## 2014-06-12 LAB — FECAL OCCULT BLOOD, GUAIAC: FECAL OCCULT BLD: NEGATIVE

## 2014-06-12 NOTE — Assessment & Plan Note (Addendum)
He refused a Hep A vaccine today I will recheck his labs to see that this has been cleared

## 2014-06-12 NOTE — Assessment & Plan Note (Addendum)
He refused a Tdap and pneumovax today Exam done Vaccines were reviewed Labs ordered Pt ed material was given

## 2014-06-12 NOTE — Progress Notes (Signed)
Pre visit review using our clinic review tool, if applicable. No additional management support is needed unless otherwise documented below in the visit note. 

## 2014-06-12 NOTE — Progress Notes (Signed)
   Subjective:    Patient ID: Daniel Wells, male    DOB: [birth year], 53 y.o.   MRN: [id number]  HPI Comments: He returns for a physical but also needs a recheck on a recent episode of grossly bloody urine, his UA was positive for blood. He has felt well since that one episode and today he offers no complaints     Review of Systems  Constitutional: Negative.  Negative for fever, chills, diaphoresis, appetite change and fatigue.  HENT: Negative.   Eyes: Negative.   Respiratory: Negative.  Negative for cough, choking, chest tightness, shortness of breath and stridor.   Cardiovascular: Negative.  Negative for chest pain, palpitations and leg swelling.  Gastrointestinal: Negative.  Negative for nausea, vomiting, abdominal pain, diarrhea and constipation.  Endocrine: Negative.   Genitourinary: Negative.  Negative for dysuria, urgency, frequency, hematuria, flank pain, decreased urine volume, discharge, penile swelling, scrotal swelling, enuresis, difficulty urinating, genital sores, penile pain and testicular pain.  Musculoskeletal: Negative.  Negative for arthralgias, back pain, myalgias and neck pain.  Skin: Negative.  Negative for rash.  Allergic/Immunologic: Negative.   Neurological: Negative.  Negative for dizziness.  Hematological: Negative.  Negative for adenopathy. Does not bruise/bleed easily.  Psychiatric/Behavioral: Negative.        Objective:   Physical Exam  Vitals reviewed. Constitutional: He is oriented to person, place, and time. He appears well-developed and well-nourished. No distress.  HENT:  Head: Normocephalic and atraumatic.  Mouth/Throat: Oropharynx is clear and moist. No oropharyngeal exudate.  Eyes: Conjunctivae are normal. Right eye exhibits no discharge. Left eye exhibits no discharge. No scleral icterus.  Neck: Normal range of motion. Neck supple. No JVD present. No tracheal deviation present. No thyromegaly present.  Cardiovascular: Normal rate, regular  rhythm, normal heart sounds and intact distal pulses.  Exam reveals no gallop and no friction rub.   No murmur heard. Pulmonary/Chest: Effort normal and breath sounds normal. No stridor. No respiratory distress. He has no wheezes. He has no rales. He exhibits no tenderness.  Abdominal: Soft. Bowel sounds are normal. He exhibits no distension and no mass. There is no tenderness. There is no rebound and no guarding. Hernia confirmed negative in the right inguinal area and confirmed negative in the left inguinal area.  Genitourinary: Rectum normal, prostate normal, testes normal and penis normal. Rectal exam shows no external hemorrhoid, no internal hemorrhoid, no fissure, no mass, no tenderness and anal tone normal. Guaiac negative stool. Prostate is not enlarged and not tender. Right testis shows no mass, no swelling and no tenderness. Right testis is descended. Left testis shows no mass, no swelling and no tenderness. Left testis is descended. Circumcised. No penile erythema or penile tenderness. No discharge found.  Musculoskeletal: Normal range of motion. He exhibits no edema and no tenderness.  Lymphadenopathy:    He has no cervical adenopathy.       Right: No inguinal adenopathy present.       Left: No inguinal adenopathy present.  Neurological: He is oriented to person, place, and time.  Skin: Skin is warm and dry. No rash noted. He is not diaphoretic. No erythema. No pallor.  Psychiatric: He has a normal mood and affect. His behavior is normal. Judgment and thought content normal.          Assessment & Plan:

## 2014-06-12 NOTE — Patient Instructions (Signed)
Health Maintenance A healthy lifestyle and preventative care can promote health and wellness.  Maintain regular health, dental, and eye exams.  Eat a healthy diet. Foods like vegetables, fruits, whole grains, low-fat dairy products, and lean protein foods contain the nutrients you need and are low in calories. Decrease your intake of foods high in solid fats, added sugars, and salt. Get information about a proper diet from your health care provider, if necessary.  Regular physical exercise is one of the most important things you can do for your health. Most adults should get at least 150 minutes of moderate-intensity exercise (any activity that increases your heart rate and causes you to sweat) each week. In addition, most adults need muscle-strengthening exercises on 2 or more days a week.   Maintain a healthy weight. The body mass index (BMI) is a screening tool to identify possible weight problems. It provides an estimate of body fat based on height and weight. Your health care provider can find your BMI and can help you achieve or maintain a healthy weight. For males 20 years and older:  A BMI below 18.5 is considered underweight.  A BMI of 18.5 to 24.9 is normal.  A BMI of 25 to 29.9 is considered overweight.  A BMI of 30 and above is considered obese.  Maintain normal blood lipids and cholesterol by exercising and minimizing your intake of saturated fat. Eat a balanced diet with plenty of fruits and vegetables. Blood tests for lipids and cholesterol should begin at age 20 and be repeated every 5 years. If your lipid or cholesterol levels are high, you are over age 50, or you are at high risk for heart disease, you may need your cholesterol levels checked more frequently.Ongoing high lipid and cholesterol levels should be treated with medicines if diet and exercise are not working.  If you smoke, find out from your health care provider how to quit. If you do not use tobacco, do not  start.  Lung cancer screening is recommended for adults aged 55-80 years who are at high risk for developing lung cancer because of a history of smoking. A yearly low-dose CT scan of the lungs is recommended for people who have at least a 30-pack-year history of smoking and are current smokers or have quit within the past 15 years. A pack year of smoking is smoking an average of 1 pack of cigarettes a day for 1 year (for example, a 30-pack-year history of smoking could mean smoking 1 pack a day for 30 years or 2 packs a day for 15 years). Yearly screening should continue until the smoker has stopped smoking for at least 15 years. Yearly screening should be stopped for people who develop a health problem that would prevent them from having lung cancer treatment.  If you choose to drink alcohol, do not have more than 2 drinks per day. One drink is considered to be 12 oz (360 mL) of beer, 5 oz (150 mL) of wine, or 1.5 oz (45 mL) of liquor.  Avoid the use of street drugs. Do not share needles with anyone. Ask for help if you need support or instructions about stopping the use of drugs.  High blood pressure causes heart disease and increases the risk of stroke. Blood pressure should be checked at least every 1-2 years. Ongoing high blood pressure should be treated with medicines if weight loss and exercise are not effective.  If you are 45-79 years old, ask your health care provider if   you should take aspirin to prevent heart disease.  Diabetes screening involves taking a blood sample to check your fasting blood sugar level. This should be done once every 3 years after age 45 if you are at a normal weight and without risk factors for diabetes. Testing should be considered at a younger age or be carried out more frequently if you are overweight and have at least 1 risk factor for diabetes.  Colorectal cancer can be detected and often prevented. Most routine colorectal cancer screening begins at the age of 50  and continues through age 75. However, your health care provider may recommend screening at an earlier age if you have risk factors for colon cancer. On a yearly basis, your health care provider may provide home test kits to check for hidden blood in the stool. A small camera at the end of a tube may be used to directly examine the colon (sigmoidoscopy or colonoscopy) to detect the earliest forms of colorectal cancer. Talk to your health care provider about this at age 50 when routine screening begins. A direct exam of the colon should be repeated every 5-10 years through age 75, unless early forms of precancerous polyps or small growths are found.  People who are at an increased risk for hepatitis B should be screened for this virus. You are considered at high risk for hepatitis B if:  You were born in a country where hepatitis B occurs often. Talk with your health care provider about which countries are considered high risk.  Your parents were born in a high-risk country and you have not received a shot to protect against hepatitis B (hepatitis B vaccine).  You have HIV or AIDS.  You use needles to inject street drugs.  You live with, or have sex with, someone who has hepatitis B.  You are a man who has sex with other men (MSM).  You get hemodialysis treatment.  You take certain medicines for conditions like cancer, organ transplantation, and autoimmune conditions.  Hepatitis C blood testing is recommended for all people born from 1945 through 1965 and any individual with known risk factors for hepatitis C.  Healthy men should no longer receive prostate-specific antigen (PSA) blood tests as part of routine cancer screening. Talk to your health care provider about prostate cancer screening.  Testicular cancer screening is not recommended for adolescents or adult males who have no symptoms. Screening includes self-exam, a health care provider exam, and other screening tests. Consult with your  health care provider about any symptoms you have or any concerns you have about testicular cancer.  Practice safe sex. Use condoms and avoid high-risk sexual practices to reduce the spread of sexually transmitted infections (STIs).  You should be screened for STIs, including gonorrhea and chlamydia if:  You are sexually active and are younger than 24 years.  You are older than 24 years, and your health care provider tells you that you are at risk for this type of infection.  Your sexual activity has changed since you were last screened, and you are at an increased risk for chlamydia or gonorrhea. Ask your health care provider if you are at risk.  If you are at risk of being infected with HIV, it is recommended that you take a prescription medicine daily to prevent HIV infection. This is called pre-exposure prophylaxis (PrEP). You are considered at risk if:  You are a man who has sex with other men (MSM).  You are a heterosexual man who   is sexually active with multiple partners.  You take drugs by injection.  You are sexually active with a partner who has HIV.  Talk with your health care provider about whether you are at high risk of being infected with HIV. If you choose to begin PrEP, you should first be tested for HIV. You should then be tested every 3 months for as long as you are taking PrEP.  Use sunscreen. Apply sunscreen liberally and repeatedly throughout the day. You should seek shade when your shadow is shorter than you. Protect yourself by wearing long sleeves, pants, a wide-brimmed hat, and sunglasses year round whenever you are outdoors.  Tell your health care provider of new moles or changes in moles, especially if there is a change in shape or color. Also, tell your health care provider if a mole is larger than the size of a pencil eraser.  A one-time screening for abdominal aortic aneurysm (AAA) and surgical repair of large AAAs by ultrasound is recommended for men aged  30-75 years who are current or former smokers.  Stay current with your vaccines (immunizations). Document Released: 04/14/2008 Document Revised: 10/22/2013 Document Reviewed: 03/14/2011 [facility] Patient Information 2015 [place], [place]. This information is not intended to replace advice given to you by your health care provider. Make sure you discuss any questions you have with your health care provider. Hematuria Hematuria is blood in your urine. It can be caused by a bladder infection, kidney infection, prostate infection, kidney stone, or cancer of your urinary tract. Infections can usually be treated with medicine, and a kidney stone usually will pass through your urine. If neither of these is the cause of your hematuria, further workup to find out the reason may be needed. It is very important that you tell your health care provider about any blood you see in your urine, even if the blood stops without treatment or happens without causing pain. Blood in your urine that happens and then stops and then happens again can be a symptom of a very serious condition. Also, pain is not a symptom in the initial stages of many urinary cancers. HOME CARE INSTRUCTIONS   Drink lots of fluid, 3-4 quarts a day. If you have been diagnosed with an infection, cranberry juice is especially recommended, in addition to large amounts of water.  Avoid caffeine, tea, and carbonated beverages because they tend to irritate the bladder.  Avoid alcohol because it may irritate the prostate.  Take all medicines as directed by your health care provider.  If you were prescribed an antibiotic medicine, finish it all even if you start to feel better.  If you have been diagnosed with a kidney stone, follow your health care provider's instructions regarding straining your urine to catch the stone.  Empty your bladder often. Avoid holding urine for long periods of time.  After a bowel movement, women should cleanse front to  back. Use each tissue only once.  Empty your bladder before and after sexual intercourse if you are a male. SEEK MEDICAL CARE IF:  You develop back pain.  You have a fever.  You have a feeling of sickness in your stomach (nausea) or vomiting.  Your symptoms are not better in 3 days. Return sooner if you are getting worse. SEEK IMMEDIATE MEDICAL CARE IF:   You develop severe vomiting and are unable to keep the medicine down.  You develop severe back or abdominal pain despite taking your medicines.  You begin passing a large amount of  blood or clots in your urine.  You feel extremely weak or faint, or you pass out. MAKE SURE YOU:   Understand these instructions.  Will watch your condition.  Will get help right away if you are not doing well or get worse. Document Released: 10/17/2005 Document Revised: 03/03/2014 Document Reviewed: 06/17/2013 [facility] Patient Information 2015 [place], [place]. This information is not intended to replace advice given to you by your health care provider. Make sure you discuss any questions you have with your health care provider.

## 2014-06-12 NOTE — Assessment & Plan Note (Signed)
He his high risk for bladder and renal cancer due to his tobacco abuse I will recheck his UA and renal function today I have ordered a CT with contrast to see if there is a mass

## 2014-06-13 ENCOUNTER — Encounter: Payer: Self-pay | Admitting: Internal Medicine

## 2014-06-13 LAB — HEPATITIS B CORE ANTIBODY, TOTAL: Hep B Core Total Ab: REACTIVE — AB

## 2014-06-13 LAB — HEPATITIS B SURFACE ANTIGEN: Hepatitis B Surface Ag: NEGATIVE

## 2014-06-13 LAB — HEPATITIS B SURFACE ANTIBODY,QUALITATIVE

## 2014-06-19 ENCOUNTER — Encounter: Payer: Self-pay | Admitting: Internal Medicine

## 2014-06-26 ENCOUNTER — Ambulatory Visit (INDEPENDENT_AMBULATORY_CARE_PROVIDER_SITE_OTHER)
Admission: RE | Admit: 2014-06-26 | Discharge: 2014-06-26 | Disposition: A | Discharge status: Home / Self Care | Payer: Commercial Managed Care - PPO | Source: Ambulatory Visit | Attending: Internal Medicine | Admitting: Internal Medicine

## 2014-06-26 ENCOUNTER — Encounter: Payer: Self-pay | Admitting: Internal Medicine

## 2014-06-26 DIAGNOSIS — R310 Gross hematuria: Secondary | ICD-10-CM

## 2014-06-26 MED ORDER — IOHEXOL 300 MG/ML  SOLN
100.0000 mL | Freq: Once | INTRAMUSCULAR | Status: AC | PRN
  Administered 2014-06-26: 100 mL via INTRAVENOUS

## 2015-01-24 ENCOUNTER — Encounter (HOSPITAL_COMMUNITY): Payer: Self-pay | Admitting: *Deleted

## 2015-01-24 ENCOUNTER — Emergency Department (HOSPITAL_COMMUNITY)
Admission: EM | Admit: 2015-01-24 | Discharge: 2015-01-24 | Disposition: A | Discharge status: Home / Self Care | Payer: Commercial Managed Care - PPO | Attending: Emergency Medicine | Admitting: Emergency Medicine

## 2015-01-24 DIAGNOSIS — Z87891 Personal history of nicotine dependence: Secondary | ICD-10-CM | POA: Diagnosis not present

## 2015-01-24 DIAGNOSIS — K047 Periapical abscess without sinus: Secondary | ICD-10-CM | POA: Insufficient documentation

## 2015-01-24 DIAGNOSIS — Z8619 Personal history of other infectious and parasitic diseases: Secondary | ICD-10-CM | POA: Insufficient documentation

## 2015-01-24 DIAGNOSIS — K088 Other specified disorders of teeth and supporting structures: Secondary | ICD-10-CM | POA: Diagnosis present

## 2015-01-24 MED ORDER — CLINDAMYCIN HCL 150 MG PO CAPS: 150.0000 mg | ORAL_CAPSULE | Freq: Four times a day (QID) | ORAL | Status: DC

## 2015-01-24 NOTE — ED Notes (Signed)
Declined W/C at D/C and was escorted to lobby by RN. 

## 2015-01-24 NOTE — ED Provider Notes (Signed)
CSN: [id number]     Arrival date & time 01/24/15  0544 History   First MD Initiated Contact with Patient 01/24/15 [phone]     Chief Complaint  Patient presents with  . Dental Pain     (Consider location/radiation/quality/duration/timing/severity/associated sxs/prior Treatment) HPI Comments: Pt comes in with c/o swelling to the cheek. Denies fever. States he doesn't really have pain but the swelling started in the last day. States that he has had problems with this tooth before. Denies problems swallowing or breathing.  The history is provided by the patient. No language interpreter was used.    Past Medical History  Diagnosis Date  . Viral hepatitis B without mention of hepatic coma, acute or unspecified, without mention of hepatitis delta    Past Surgical History  Procedure Laterality Date  . Tympanostomy tube placement    . Tonsillectomy and adenoidectomy    . Tonsillectomy     Family History  Problem Relation Age of Onset  . Adopted: Yes   History  Substance Use Topics  . Smoking status: Former Smoker -- 1.00 packs/day for 40 years    Types: Cigarettes    Quit date: 11/12/2012  . Smokeless tobacco: Never Used  . Alcohol Use: No    Review of Systems  All other systems reviewed and are negative.     Allergies  Review of patient's allergies indicates no known allergies.  Home Medications   Prior to Admission medications   Medication Sig Start Date End Date Taking? Authorizing Provider  clindamycin (CLEOCIN) 150 MG capsule Take 1 capsule (150 mg total) by mouth every 6 (six) hours. 01/24/15   Glendell Docker, NP   BP 123/82 mmHg  Pulse 75  Temp(Src) 98.2 F (36.8 C) (Oral)  Resp 18  Ht 5\' 10"  (1.778 m)  Wt 155 lb (70.308 kg)  BMI 22.24 kg/m2  SpO2 97% Physical Exam  Constitutional: He is oriented to person, place, and time. He appears well-developed and well-nourished.  HENT:  Right Ear: External ear normal.  Left Ear: External ear normal.  Swelling  noted to the left cheek. No problems swallowing or breathing  Cardiovascular: Normal rate and regular rhythm.   Pulmonary/Chest: Effort normal.  Musculoskeletal: Normal range of motion.  Neurological: He is alert and oriented to person, place, and time.  Skin: Skin is warm and dry.  Nursing note and vitals reviewed.   ED Course  Procedures (including critical care time) Labs Review Labs Reviewed - No data to display  Imaging Review No results found.   EKG Interpretation None      MDM   Final diagnoses:  Dental abscess    Treated with clindamycin. Discussed dental follow up   Glendell Docker, NP 01/24/15 [place], MD 01/27/15 [phone]

## 2015-01-24 NOTE — ED Notes (Signed)
The pt is c/o a toothache and face swollen for 24 hours

## 2015-01-24 NOTE — Discharge Instructions (Signed)
Follow up with your dentist as discussed  Dental Abscess A dental abscess is a collection of infected fluid (pus) from a bacterial infection in the inner part of the tooth (pulp). It usually occurs at the end of the tooth's root.  CAUSES   Severe tooth decay.  Trauma to the tooth that allows bacteria to enter into the pulp, such as a broken or chipped tooth. SYMPTOMS   Severe pain in and around the infected tooth.  Swelling and redness around the abscessed tooth or in the mouth or face.  Tenderness.  Pus drainage.  Bad breath.  Bitter taste in the mouth.  Difficulty swallowing.  Difficulty opening the mouth.  Nausea.  Vomiting.  Chills.  Swollen neck glands. DIAGNOSIS   A medical and dental history will be taken.  An examination will be performed by tapping on the abscessed tooth.  X-rays may be taken of the tooth to identify the abscess. TREATMENT The goal of treatment is to eliminate the infection. You may be prescribed antibiotic medicine to stop the infection from spreading. A root canal may be performed to save the tooth. If the tooth cannot be saved, it may be pulled (extracted) and the abscess may be drained.  HOME CARE INSTRUCTIONS  Only take over-the-counter or prescription medicines for pain, fever, or discomfort as directed by your caregiver.  Rinse your mouth (gargle) often with salt water ( tsp salt in 8 oz [250 ml] of warm water) to relieve pain or swelling.  Do not drive after taking pain medicine (narcotics).  Do not apply heat to the outside of your face.  Return to your dentist for further treatment as directed. SEEK MEDICAL CARE IF:  Your pain is not helped by medicine.  Your pain is getting worse instead of better. SEEK IMMEDIATE MEDICAL CARE IF:  You have a fever or persistent symptoms for more than 2-3 days.  You have a fever and your symptoms suddenly get worse.  You have chills or a very bad headache.  You have problems  breathing or swallowing.  You have trouble opening your mouth.  You have swelling in the neck or around the eye. Document Released: 10/17/2005 Document Revised: 07/11/2012 Document Reviewed: 01/25/2011 [facility] Patient Information 2015 [place], [place]. This information is not intended to replace advice given to you by your health care provider. Make sure you discuss any questions you have with your health care provider.

## 2015-06-17 ENCOUNTER — Ambulatory Visit: Payer: Commercial Managed Care - PPO | Admitting: Internal Medicine

## 2015-06-23 ENCOUNTER — Other Ambulatory Visit (INDEPENDENT_AMBULATORY_CARE_PROVIDER_SITE_OTHER): Payer: Commercial Managed Care - PPO

## 2015-06-23 ENCOUNTER — Ambulatory Visit (INDEPENDENT_AMBULATORY_CARE_PROVIDER_SITE_OTHER): Payer: Commercial Managed Care - PPO | Admitting: Internal Medicine

## 2015-06-23 ENCOUNTER — Encounter: Payer: Self-pay | Admitting: Internal Medicine

## 2015-06-23 VITALS — BP 138/80 | HR 74 | Temp 98.0°F | Resp 16 | Ht 70.0 in | Wt 153.0 lb

## 2015-06-23 DIAGNOSIS — R197 Diarrhea, unspecified: Secondary | ICD-10-CM | POA: Insufficient documentation

## 2015-06-23 DIAGNOSIS — B180 Chronic viral hepatitis B with delta-agent: Secondary | ICD-10-CM

## 2015-06-23 LAB — COMPREHENSIVE METABOLIC PANEL
ALT: 11 U/L (ref 0–53)
AST: 16 U/L (ref 0–37)
Albumin: 4.6 g/dL (ref 3.5–5.2)
Alkaline Phosphatase: 99 U/L (ref 39–117)
BUN: 23 mg/dL (ref 6–23)
CALCIUM: 10.3 mg/dL (ref 8.4–10.5)
CHLORIDE: 105 meq/L (ref 96–112)
CO2: 26 meq/L (ref 19–32)
CREATININE: 0.83 mg/dL (ref 0.40–1.50)
GFR: 101.74 mL/min (ref >60.00)
GLUCOSE: 95 mg/dL (ref 70–99)
Potassium: 3.8 mEq/L (ref 3.5–5.1)
SODIUM: 139 meq/L (ref 135–145)
Total Bilirubin: 0.7 mg/dL (ref 0.2–1.2)
Total Protein: 7.3 g/dL (ref 6.0–8.3)

## 2015-06-23 LAB — CBC WITH DIFFERENTIAL/PLATELET
BASOS ABS: 0 10*3/uL (ref 0.0–0.1)
BASOS PCT: 0.5 % (ref 0.0–3.0)
EOS ABS: 0.2 10*3/uL (ref 0.0–0.7)
Eosinophils Relative: 2.3 % (ref 0.0–5.0)
HCT: 47.7 % (ref 39.0–52.0)
Hemoglobin: 16.3 g/dL (ref 13.0–17.0)
LYMPHS ABS: 2.5 10*3/uL (ref 0.7–4.0)
Lymphocytes Relative: 27.1 % (ref 12.0–46.0)
MCHC: 34.1 g/dL (ref 30.0–36.0)
MCV: 90.1 fl (ref 78.0–100.0)
MONO ABS: 0.6 10*3/uL (ref 0.1–1.0)
Monocytes Relative: 6.3 % (ref 3.0–12.0)
NEUTROS ABS: 5.8 10*3/uL (ref 1.4–7.7)
NEUTROS PCT: 63.8 % (ref 43.0–77.0)
PLATELETS: 221 10*3/uL (ref 150.0–400.0)
RBC: 5.29 Mil/uL (ref 4.22–5.81)
RDW: 13.4 % (ref 11.5–15.5)
WBC: 9.1 10*3/uL (ref 4.0–10.5)

## 2015-06-23 LAB — SEDIMENTATION RATE: Sed Rate: 8 mm/hr (ref 0–22)

## 2015-06-23 NOTE — Progress Notes (Signed)
Subjective:  Patient ID: Daniel Wells, male    DOB: Aug 31, 1961  Age: 54 y.o. MRN: [id number]  CC: Diarrhea   HPI Kyel Purk presents for 5 month history of loose stools. He indicates that this started after he took a course of clindamycin. He describes about 1-2 bowel movements a day that are loose and urgent. He has tried yogurt and probiotic without any relief from the symptoms.  Outpatient Prescriptions Prior to Visit  Medication Sig Dispense Refill  . clindamycin (CLEOCIN) 150 MG capsule Take 1 capsule (150 mg total) by mouth every 6 (six) hours. 28 capsule 0   No facility-administered medications prior to visit.    ROS Review of Systems  Constitutional: Negative.  Negative for fever, chills, diaphoresis, appetite change and fatigue.  HENT: Negative.  Negative for sore throat and trouble swallowing.   Eyes: Negative.   Respiratory: Negative.  Negative for cough, choking, chest tightness, shortness of breath and stridor.   Cardiovascular: Negative.  Negative for chest pain, palpitations and leg swelling.  Gastrointestinal: Positive for diarrhea. Negative for nausea, vomiting, abdominal pain, constipation, blood in stool, abdominal distention, anal bleeding and rectal pain.  Endocrine: Negative.   Genitourinary: Negative.  Negative for frequency, flank pain, decreased urine volume and difficulty urinating.  Musculoskeletal: Negative.  Negative for myalgias and back pain.  Skin: Negative.  Negative for rash.  Allergic/Immunologic: Negative.   Neurological: Negative.  Negative for dizziness and weakness.  Hematological: Negative.  Negative for adenopathy. Does not bruise/bleed easily.  Psychiatric/Behavioral: Negative.     Objective:  BP 138/80 mmHg  Pulse 74  Temp(Src) 98 F (36.7 C) (Oral)  Resp 16  Ht 5\' 10"  (1.778 m)  Wt 153 lb (69.4 kg)  BMI 21.95 kg/m2  SpO2 96%  BP Readings from Last 3 Encounters:  06/23/15 138/80  01/24/15 123/82  06/12/14 140/82     Wt Readings from Last 3 Encounters:  06/23/15 153 lb (69.4 kg)  01/24/15 155 lb (70.308 kg)  06/12/14 155 lb (70.308 kg)    Physical Exam  Constitutional: He is oriented to person, place, and time. No distress.  HENT:  Head: Normocephalic and atraumatic.  Mouth/Throat: Oropharynx is clear and moist. No oropharyngeal exudate.  Eyes: Conjunctivae are normal. Right eye exhibits no discharge. Left eye exhibits no discharge. No scleral icterus.  Neck: Normal range of motion. Neck supple. No JVD present. No tracheal deviation present. No thyromegaly present.  Cardiovascular: Normal rate, regular rhythm, normal heart sounds and intact distal pulses.  Exam reveals no gallop and no friction rub.   No murmur heard. Pulmonary/Chest: Effort normal and breath sounds normal. No stridor. No respiratory distress. He has no wheezes. He has no rales. He exhibits no tenderness.  Abdominal: Soft. Bowel sounds are normal. He exhibits no distension and no mass. There is no tenderness. There is no rebound and no guarding.  Musculoskeletal: Normal range of motion. He exhibits no edema or tenderness.  Lymphadenopathy:    He has no cervical adenopathy.  Neurological: He is oriented to person, place, and time.  Skin: Skin is warm and dry. No rash noted. He is not diaphoretic. No erythema. No pallor.  Psychiatric: He has a normal mood and affect. His behavior is normal. Judgment and thought content normal.  Vitals reviewed.   Lab Results  Component Value Date   WBC 9.1 06/23/2015   HGB 16.3 06/23/2015   HCT 47.7 06/23/2015   PLT 221.0 06/23/2015   GLUCOSE 95 06/23/2015   CHOL  190 06/12/2014   TRIG 106.0 06/12/2014   HDL 36.30* 06/12/2014   LDLCALC 133* 06/12/2014   ALT 11 06/23/2015   AST 16 06/23/2015   NA 139 06/23/2015   K 3.8 06/23/2015   CL 105 06/23/2015   CREATININE 0.83 06/23/2015   BUN 23 06/23/2015   CO2 26 06/23/2015   TSH 0.97 06/12/2014   PSA 0.38 06/12/2014    No results  found.  Assessment & Plan:   Mareo was seen today for diarrhea.  Diagnoses and all orders for this visit:  Diarrhea- he appears to have antibiotic associated diarrhea. His stools are loose and only once or twice a day. His symptoms are not severe enough to warrant any intervention at this time. I will screen him for C. difficile infection, enteric infections, celiac disease, and inflammatory bowel disease with the labs listed below. In the meantime I have asked him to continue taking the probiotic. 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 -     CBC with Differential/Platelet; Future -     Comprehensive metabolic panel; Future -     Hepatitis B DNA, ultraquantitative, PCR; Future  I have discontinued Mr. Furio's clindamycin.  No orders of the defined types were placed in this encounter.     Follow-up: Return in about 4 weeks (around 07/21/2015).  Scarlette Calico, MD

## 2015-06-23 NOTE — Progress Notes (Signed)
Pre visit review using our clinic review tool, if applicable. No additional management support is needed unless otherwise documented below in the visit note. 

## 2015-06-23 NOTE — Patient Instructions (Signed)
Chronic Diarrhea  Diarrhea is frequent loose and watery bowel movements. It can cause you to feel weak and dehydrated. Dehydration can cause you to become tired and thirsty and to have a dry mouth, decreased urination, and dark yellow urine. Diarrhea is a sign of another problem, most often an infection that will not last long. In most cases, diarrhea lasts 2-3 days. Diarrhea that lasts longer than 4 weeks is called long-lasting (chronic) diarrhea. It is important to treat your diarrhea as directed by your health care provider to lessen or prevent future episodes of diarrhea.   CAUSES   There are many causes of chronic diarrhea. The following are some possible causes:   · Gastrointestinal infections caused by viruses, bacteria, or parasites.    · Food poisoning or food allergies.    · Certain medicines, such as antibiotics, chemotherapy, and laxatives.    · Artificial sweeteners and fructose.    · Digestive disorders, such as celiac disease and inflammatory bowel diseases.    · Irritable bowel syndrome.  · Some disorders of the pancreas.  · Disorders of the thyroid.  · Reduced blood flow to the intestines.  · Cancer.  Sometimes the cause of chronic diarrhea is unknown.  RISK FACTORS  · Having a severely weakened immune system, such as from HIV or AIDS.    · Taking certain types of cancer-fighting drugs (such as with chemotherapy) or other medicines.    · Having had a recent organ transplant.    · Having a portion of the stomach or small bowel removed.    · Traveling to countries where food and water supplies are often contaminated.    SYMPTOMS   In addition to frequent, loose stools, diarrhea may cause:   · Cramping.    · Abdominal pain.    · Nausea.    · Fever.  · Fatigue.  · Urgent need to use the bathroom.  · Loss of bowel control.  DIAGNOSIS   Your health care provider must take a careful history and perform a physical exam. Tests given are based on your symptoms and history. Tests may include:   · Blood or  stool tests. Three or more stool samples may be examined. Stool cultures may be used to test for bacteria or parasites.    · X-rays.    · A procedure in which a thin tube is inserted into the mouth or rectum (endoscopy). This allows the health care provider to look inside the intestine.    TREATMENT   · Treatment is aimed at correcting the cause of the diarrhea when possible.  · Diarrhea caused by an infection can often be treated with antibiotic medicines.  · Diarrhea not caused by an infection may require you to take long-term medicine or have surgery. Specific treatment should be discussed with your health care provider.  · If the cause cannot be determined, treatment aims to relieve symptoms and prevent dehydration. Serious health problems can occur if you do not maintain proper fluid levels. Treatment may include:  ¨ Taking an oral rehydration solution (ORS).  ¨ Not drinking beverages that contain caffeine (such as tea, coffee, and soft drinks).  ¨ Not drinking alcohol.  ¨ Maintaining well-balanced nutrition to help you recover faster.  HOME CARE INSTRUCTIONS   · Drink enough fluids to keep urine clear or pale yellow. Drink 1 cup (8 oz) of fluid for each diarrhea episode. Avoid fluids that contain simple sugars, fruit juices, whole milk products, and sodas. Hydrate with an ORS. You may purchase the ORS or prepare it at home by mixing the   following ingredients together:  ¨  - tsp (1.7-3  mL) table salt.  ¨ ¾ tsp (3 ¾ mL) baking soda.  ¨  tsp (1.7 mL) salt substitute containing potassium chloride.  ¨ 1 tbsp (20 mL) sugar.  ¨ 4.2 c (1 L) of water.    · Certain foods and beverages may increase the speed at which food moves through the gastrointestinal (GI) tract. These foods and beverages should be avoided. They include:  ¨ Caffeinated and alcoholic beverages.  ¨ High-fiber foods, such as raw fruits and vegetables, nuts, seeds, and whole grain breads and cereals.  ¨ Foods and beverages sweetened with sugar  alcohols, such as xylitol, sorbitol, and mannitol.    · Some foods may be well tolerated and may help thicken stool. These include:  ¨ Starchy foods, such as rice, toast, pasta, low-sugar cereal, oatmeal, grits, baked potatoes, crackers, and bagels.  ¨ Bananas.  ¨ Applesauce.  · Add probiotic-rich foods to help increase healthy bacteria in the GI tract. These include yogurt and fermented milk products.  · Wash your hands well after each diarrhea episode.  · Only take over-the-counter or prescription medicines as directed by your health care provider.  · Take a warm bath to relieve any burning or pain from frequent diarrhea episodes.  SEEK MEDICAL CARE IF:   · You are not urinating as often.  · Your urine is a dark color.  · You become very tired or dizzy.  · You have severe pain in the abdomen or rectum.  · Your have blood or pus in your stools.  · Your stools look black and tarry.  SEEK IMMEDIATE MEDICAL CARE IF:   · You are unable to keep fluids down.  · You have persistent vomiting.  · You have blood in your stool.  · Your stools are black and tarry.  · You do not urinate in 6-8 hours, or there is only a small amount of very dark urine.  · You have abdominal pain that increases or localizes.  · You have weakness, dizziness, confusion, or lightheadedness.  · You have a severe headache.  · Your diarrhea gets worse or does not get better.  · You have a fever or persistent symptoms for more than 2-3 days.  · You have a fever and your symptoms suddenly get worse.  MAKE SURE YOU:   · Understand these instructions.  · Will watch your condition.  · Will get help right away if you are not doing well or get worse.  Document Released: 01/07/2004 Document Revised: 10/22/2013 Document Reviewed: 04/11/2013  [employer]® Patient Information ©2015 [employer]. This information is not intended to replace advice given to you by your health care provider. Make sure you discuss any questions you have with your health care  provider.

## 2015-06-25 ENCOUNTER — Other Ambulatory Visit: Payer: Commercial Managed Care - PPO

## 2015-06-25 ENCOUNTER — Other Ambulatory Visit: Payer: Self-pay | Admitting: Internal Medicine

## 2015-06-25 DIAGNOSIS — R197 Diarrhea, unspecified: Secondary | ICD-10-CM

## 2015-06-25 LAB — TISSUE TRANSGLUTAMINASE, IGA: TISSUE TRANSGLUTAMINASE AB, IGA: 1 U/mL (ref <4)

## 2015-06-25 LAB — GLIADIN ANTIBODIES, SERUM
GLIADIN IGA: 3 U (ref <20)
Gliadin IgG: 7 Units (ref <20)

## 2015-06-26 LAB — RETICULIN ANTIBODIES, IGA W TITER: Reticulin Ab, IgA: NEGATIVE

## 2015-06-26 LAB — C. DIFFICILE GDH AND TOXIN A/B

## 2015-06-26 LAB — FECAL LACTOFERRIN, QUANT: Lactoferrin: NEGATIVE

## 2015-06-28 LAB — HEPATITIS B DNA, ULTRAQUANTITATIVE, PCR: HEPATITIS B DNA: NOT DETECTED [IU]/mL (ref <20)

## 2015-06-29 ENCOUNTER — Encounter: Payer: Self-pay | Admitting: Internal Medicine

## 2015-07-09 IMAGING — CT CT ABD-PEL WO/W CM
1 of 3 series · 12 of 32 positions shown, 18 images · IV contrast (omnipaque)
Comparison: 04/13/2007 noncontrast CT abdomen/ pelvis

CLINICAL DATA: Gross hematuria

EXAM:
CT ABDOMEN AND PELVIS WITHOUT AND WITH CONTRAST
TECHNIQUE: Multidetector CT imaging of the abdomen and pelvis was performed
following the standard protocol before and following the bolus
administration of intravenous contrast.
CONTRAST:  100mL OMNIPAQUE IOHEXOL 300 MG/ML  SOLN

[Series 4: renal nephrographic · axial · 0.65mm/px · z∈[-414,-54]mm · 12 of 142 slices shown, 18 images]
[im 11/142  soft-tissue]
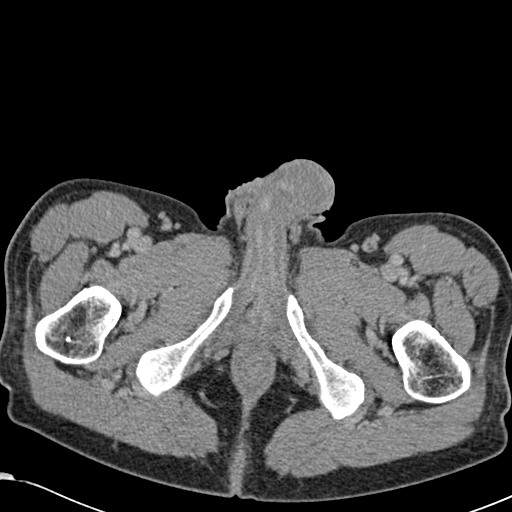
[im 11/142  bone]
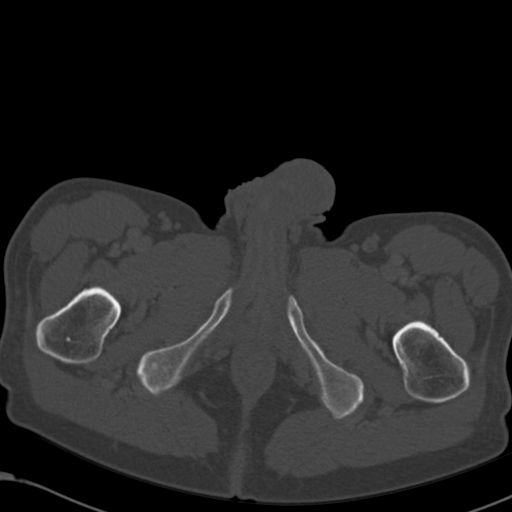
[im 21/142  soft-tissue]
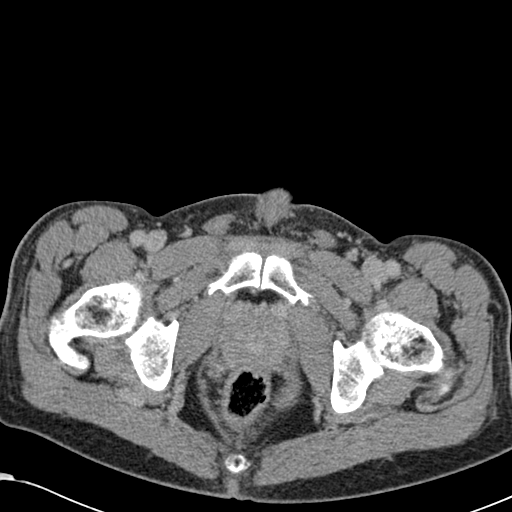
[im 31/142  soft-tissue]
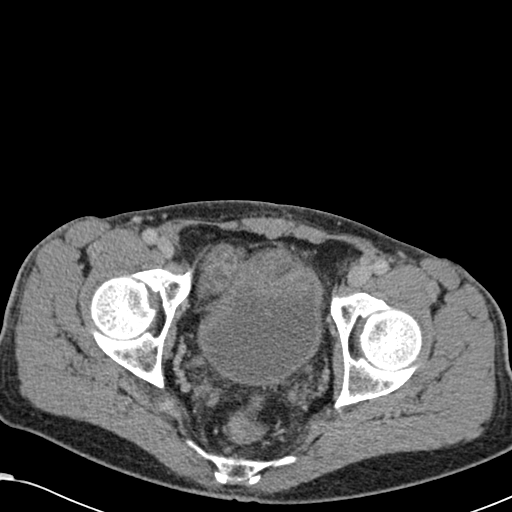
[im 41/142  soft-tissue]
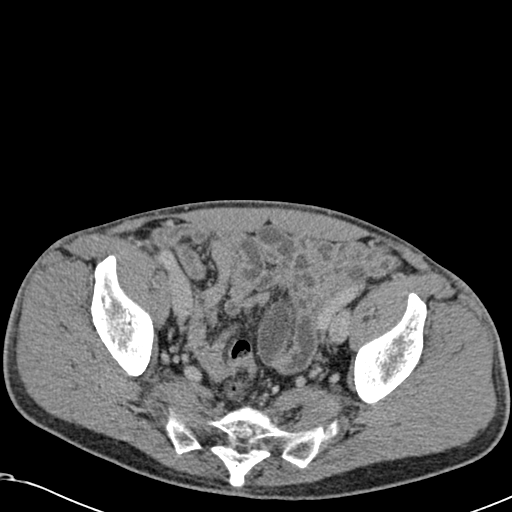
[im 51/142  soft-tissue]
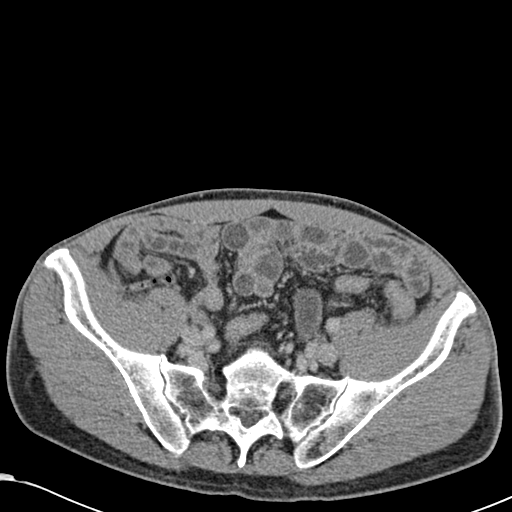
[im 61/142  soft-tissue]
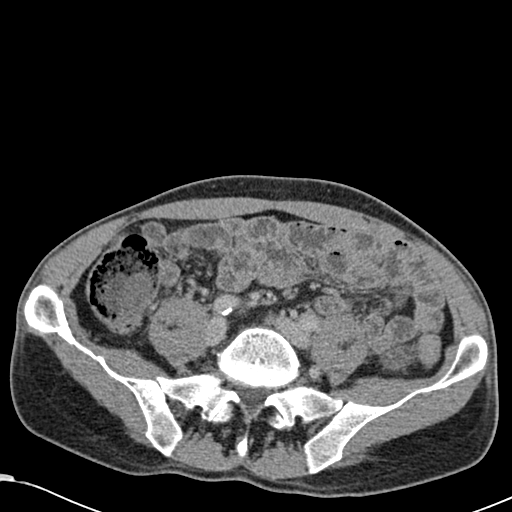
[im 81/142  soft-tissue]
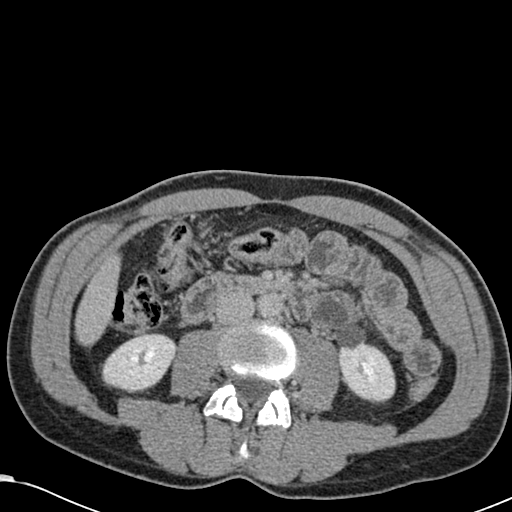
[im 91/142  soft-tissue]
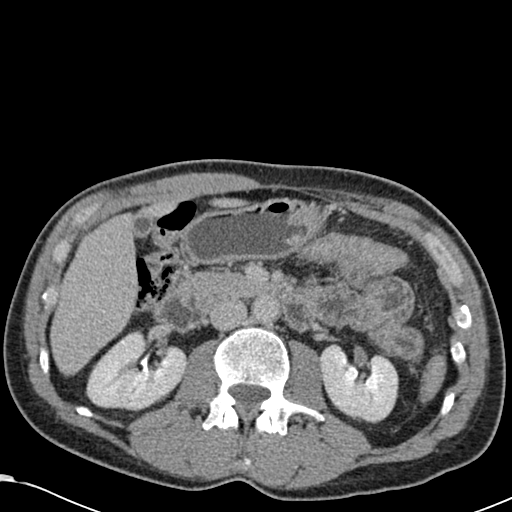
[im 101/142  soft-tissue]
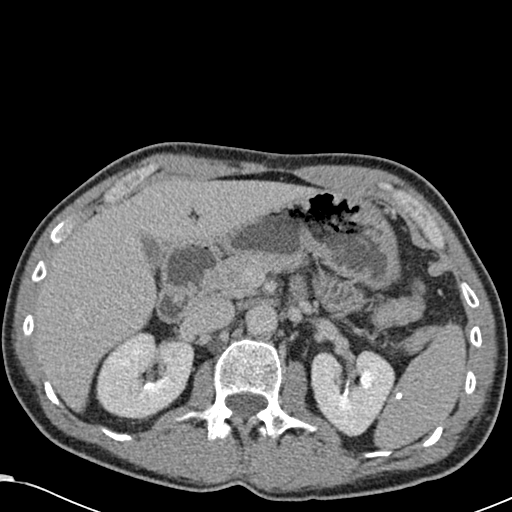
[im 101/142  lung]
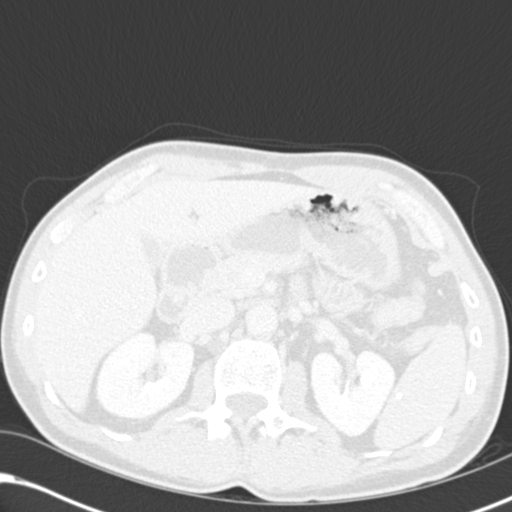
[im 101/142  bone]
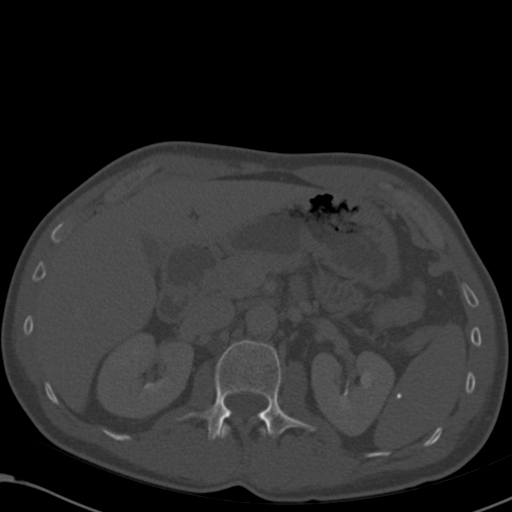
[im 111/142  soft-tissue]
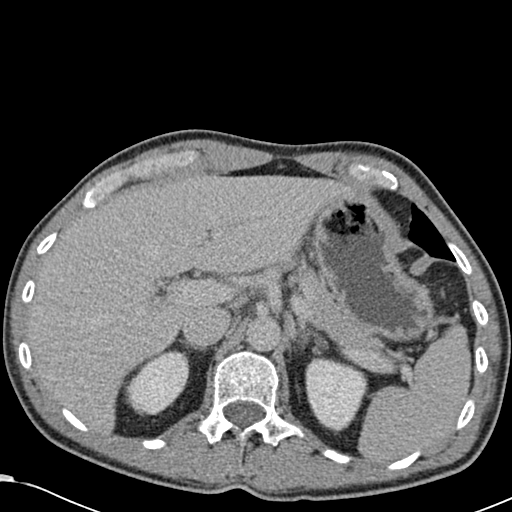
[im 111/142  lung]
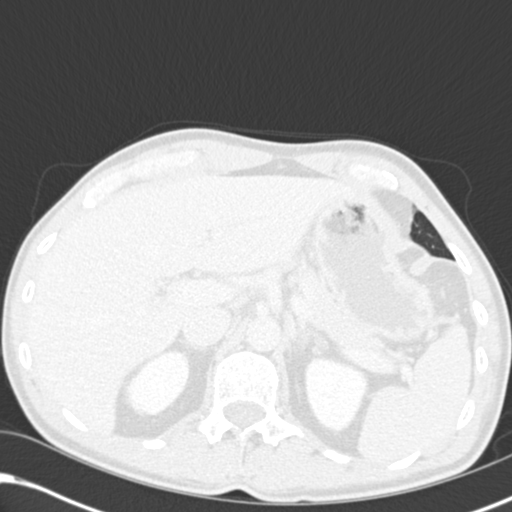
[im 121/142  soft-tissue]
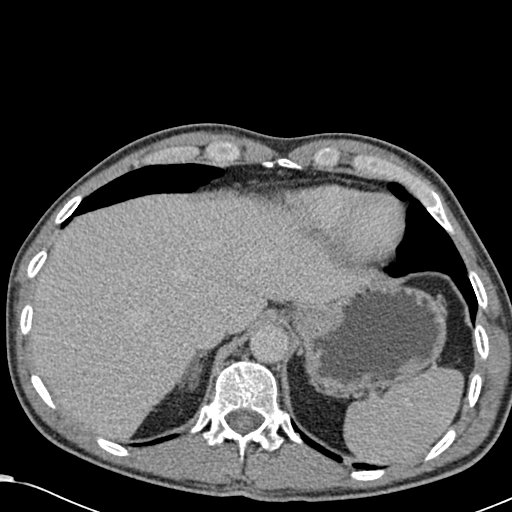
[im 121/142  lung]
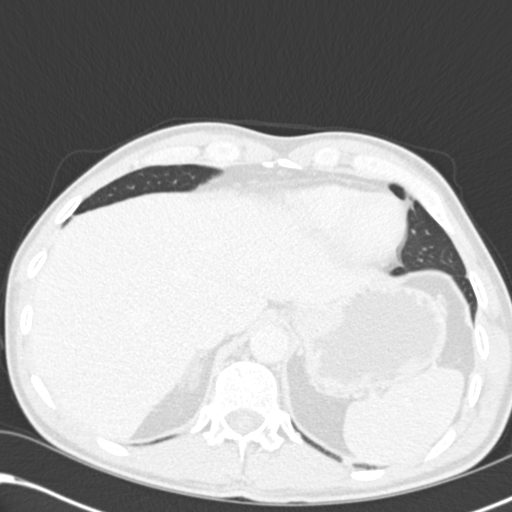
[im 131/142  soft-tissue]
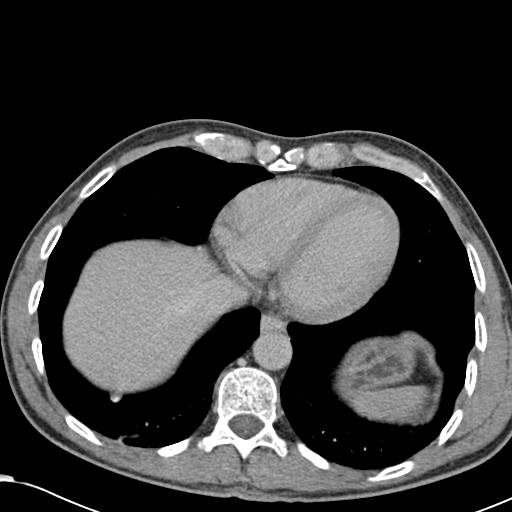
[im 131/142  lung]
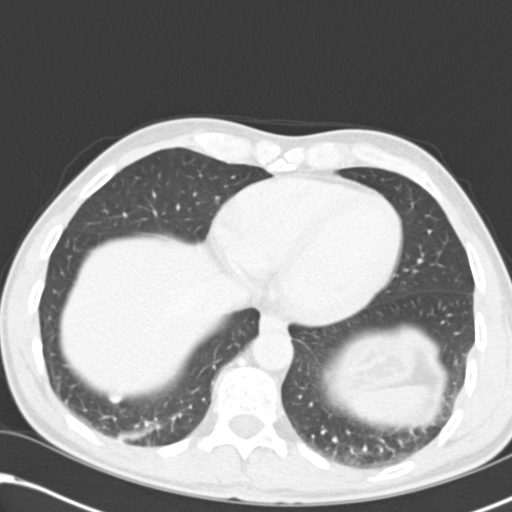

[12 of 32 positions shown; findings below may reference images not displayed]

FINDINGS: Minimal bibasilar dependent atelectasis is noted. Right lower lobe
calcified granuloma is noted. At unenhanced imaging, performed
through the kidneys and upper ureters only as per ordered renal mass
protocol exam, no radiopaque renal or proximal ureteral calculus is
identified.

At arterial phase imaging, no abnormal liver hyper enhancement is
identified. The celiac axis, superior mesenteric artery, and
inferior mesenteric artery are patent. Mild atheromatous aortic
calcification noted without aneurysm.

The adrenal glands are diffusely thickened without focal measurable
mass. Bilateral sub cm too small to characterize renal cortical
hypodense lesions are identified as well as bilateral renal cortical
cysts, largest left lower renal pole measuring 1.7 cm and right
upper renal pole measuring 1.2 cm, respectively. No
hydroureteronephrosis. No enhancing renal mass lesion is identified.

Liver, gallbladder, and pancreas are unremarkable. The spleen is
unremarkable with the exception of granulomas.

No ascites, free air, or lymphadenopathy. The bladder is partly
decompressed but unremarkable. The appendix is normal. No radiopaque
ureteral or bladder calculus is identified, allowing for
visualization at postcontrast imaging.

No lytic or sclerotic osseous lesion. Right iliac bone deformity is
reidentified, possibly related to remote trauma. No acute osseous
abnormality is identified.
IMPRESSION: No etiology identified to explain the history of gross hematuria. No
enhancing renal mass identified. Please note that this examination
protocol performed was for specific evaluation for a renal mass and
therefore delayed postcontrast imaging through the entirety of the
urothelial collecting system was not performed, as would be done as
part of dedicated CT urography protocol typically used for the
clinical indication of gross hematuria. Allowing for this, no upper
urothelial tract filling defect or calculus is identified. If there
is persistent gross hematuria and specific evaluation of the entire
urothelial collecting system is desired, consider MRI urography
without and with contrast for further evaluation, to reduce further
radiation dose and facilitate imaging of the entire urothelial
tract.

Bilateral renal cortical cysts and sub cm too small to characterize
renal cortical hypodense lesions, statistically most likely cysts
but not further characterizable. No further imaging followup for
these probably benign lesions is specifically indicated. This
recommendation follows ACR consensus guidelines: Managing Incidental
Findings on Abdominal CT: White Paper of the ACR Incidental Findings
Committee. [HOSPITAL] [id number];[DATE] required.

## 2017-02-16 ENCOUNTER — Telehealth: Payer: Self-pay

## 2017-02-24 NOTE — Telephone Encounter (Signed)
error 

## 2021-03-04 ENCOUNTER — Encounter: Payer: Self-pay | Admitting: Internal Medicine

## 2021-08-30 ENCOUNTER — Other Ambulatory Visit: Payer: Self-pay

## 2021-08-30 ENCOUNTER — Ambulatory Visit: Payer: Self-pay | Admitting: Family Medicine

## 2021-08-30 ENCOUNTER — Encounter: Payer: Self-pay | Admitting: Family Medicine

## 2021-08-30 VITALS — BP 148/80 | HR 85 | Temp 97.7°F | Ht 70.0 in | Wt 145.0 lb

## 2021-08-30 DIAGNOSIS — Z13220 Encounter for screening for lipoid disorders: Secondary | ICD-10-CM

## 2021-08-30 DIAGNOSIS — Z13228 Encounter for screening for other metabolic disorders: Secondary | ICD-10-CM

## 2021-08-30 DIAGNOSIS — F17210 Nicotine dependence, cigarettes, uncomplicated: Secondary | ICD-10-CM

## 2021-08-30 DIAGNOSIS — Z1211 Encounter for screening for malignant neoplasm of colon: Secondary | ICD-10-CM

## 2021-08-30 DIAGNOSIS — Z7689 Persons encountering health services in other specified circumstances: Secondary | ICD-10-CM

## 2021-08-30 DIAGNOSIS — R030 Elevated blood-pressure reading, without diagnosis of hypertension: Secondary | ICD-10-CM

## 2021-08-30 DIAGNOSIS — Z125 Encounter for screening for malignant neoplasm of prostate: Secondary | ICD-10-CM

## 2021-08-30 NOTE — Patient Instructions (Signed)
Would like you to pop in for a blood pressure check with the nurse (no charge) in about 2 weeks.  Your goal blood pressure is less than 140/90.  Just a little elevated today.  Return the fecal occult card at that time.  Heart-Healthy Eating Plan Heart-healthy meal planning includes: Eating less unhealthy fats. Eating more healthy fats. Making other changes in your diet. Talk with your doctor or a diet specialist (dietitian) to create an eating plan that is right for you. What is my plan? Your doctor may recommend an eating plan that includes: Total fat: ______% or less of total calories a day. Saturated fat: ______% or less of total calories a day. Cholesterol: less than _________mg a day. What are tips for following this plan? Cooking Avoid frying your food. Try to bake, boil, grill, or broil it instead. You can also reduce fat by: Removing the skin from poultry. Removing all visible fats from meats. Steaming vegetables in water or broth. Meal planning  At meals, divide your plate into four equal parts: Fill one-half of your plate with vegetables and green salads. Fill one-fourth of your plate with whole grains. Fill one-fourth of your plate with lean protein foods. Eat 4-5 servings of vegetables per day. A serving of vegetables is: 1 cup of raw or cooked vegetables. 2 cups of raw leafy greens. Eat 4-5 servings of fruit per day. A serving of fruit is: 1 medium whole fruit.  cup of dried fruit.  cup of fresh, frozen, or canned fruit.  cup of 100% fruit juice. Eat more foods that have soluble fiber. These are apples, broccoli, carrots, beans, peas, and barley. Try to get 20-30 g of fiber per day. Eat 4-5 servings of nuts, legumes, and seeds per week: 1 serving of dried beans or legumes equals  cup after being cooked. 1 serving of nuts is  cup. 1 serving of seeds equals 1 tablespoon. General information Eat more home-cooked food. Eat less restaurant, buffet, and fast  food. Limit or avoid alcohol. Limit foods that are high in starch and sugar. Avoid fried foods. Lose weight if you are overweight. Keep track of how much salt (sodium) you eat. This is important if you have high blood pressure. Ask your doctor to tell you more about this. Try to add vegetarian meals each week. Fats Choose healthy fats. These include olive oil and canola oil, flaxseeds, walnuts, almonds, and seeds. Eat more omega-3 fats. These include salmon, mackerel, sardines, tuna, flaxseed oil, and ground flaxseeds. Try to eat fish at least 2 times each week. Check food labels. Avoid foods with trans fats or high amounts of saturated fat. Limit saturated fats. These are often found in animal products, such as meats, butter, and cream. These are also found in plant foods, such as palm oil, palm kernel oil, and coconut oil. Avoid foods with partially hydrogenated oils in them. These have trans fats. Examples are stick margarine, some tub margarines, cookies, crackers, and other baked goods. What foods can I eat? Fruits All fresh, canned (in natural juice), or frozen fruits. Vegetables Fresh or frozen vegetables (raw, steamed, roasted, or grilled). Green salads. Grains Most grains. Choose whole wheat and whole grains most of the time. Rice and pasta, including brown rice and pastas made with whole wheat. Meats and other proteins Lean, well-trimmed beef, veal, pork, and lamb. Chicken and [place] without skin. All fish and shellfish. Wild duck, rabbit, pheasant, and venison. Egg whites or low-cholesterol egg substitutes. Dried beans, peas, lentils,  and tofu. Seeds and most nuts. Dairy Low-fat or nonfat cheeses, including ricotta and mozzarella. Skim or 1% milk that is liquid, powdered, or evaporated. Buttermilk that is made with low-fat milk. Nonfat or low-fat yogurt. Fats and oils Non-hydrogenated (trans-free) margarines. Vegetable oils, including soybean, sesame, sunflower, olive, peanut,  safflower, corn, canola, and cottonseed. Salad dressings or mayonnaise made with a vegetable oil. Beverages Mineral water. Coffee and tea. Diet carbonated beverages. Sweets and desserts Sherbet, gelatin, and fruit ice. Small amounts of dark chocolate. Limit all sweets and desserts. Seasonings and condiments All seasonings and condiments. The items listed above may not be a complete list of foods and drinks you can eat. Contact a dietitian for more options. What foods should I avoid? Fruits Canned fruit in heavy syrup. Fruit in cream or butter sauce. Fried fruit. Limit coconut. Vegetables Vegetables cooked in cheese, cream, or butter sauce. Fried vegetables. Grains Breads that are made with saturated or trans fats, oils, or whole milk. Croissants. Sweet rolls. Donuts. High-fat crackers, such as cheese crackers. Meats and other proteins Fatty meats, such as hot dogs, ribs, sausage, bacon, rib-eye roast or steak. High-fat deli meats, such as salami and bologna. Caviar. Domestic duck and goose. Organ meats, such as liver. Dairy Cream, sour cream, cream cheese, and creamed cottage cheese. Whole-milk cheeses. Whole or 2% milk that is liquid, evaporated, or condensed. Whole buttermilk. Cream sauce or high-fat cheese sauce. Yogurt that is made from whole milk. Fats and oils Meat fat, or shortening. Cocoa butter, hydrogenated oils, palm oil, coconut oil, palm kernel oil. Solid fats and shortenings, including bacon fat, salt pork, lard, and butter. Nondairy cream substitutes. Salad dressings with cheese or sour cream. Beverages Regular sodas and juice drinks with added sugar. Sweets and desserts Frosting. Pudding. Cookies. Cakes. Pies. Milk chocolate or white chocolate. Buttered syrups. Full-fat ice cream or ice cream drinks. The items listed above may not be a complete list of foods and drinks to avoid. Contact a dietitian for more information. Summary Heart-healthy meal planning includes eating  less unhealthy fats, eating more healthy fats, and making other changes in your diet. Eat a balanced diet. This includes fruits and vegetables, low-fat or nonfat dairy, lean protein, nuts and legumes, whole grains, and heart-healthy oils and fats. This information is not intended to replace advice given to you by your health care provider. Make sure you discuss any questions you have with your health care provider. Document Revised: 02/25/2021 Document Reviewed: 02/25/2021 Elsevier Patient Education  2022 Elsevier Inc.   Preventive Care 37-32 Years Old, Male Preventive care refers to lifestyle choices and visits with your health care provider that can promote health and wellness. This includes: A yearly physical exam. This is also called an annual wellness visit. Regular dental and eye exams. Immunizations. Screening for certain conditions. Healthy lifestyle choices, such as: Eating a healthy diet. Getting regular exercise. Not using drugs or products that contain nicotine and tobacco. Limiting alcohol use. What can I expect for my preventive care visit? Physical exam Your health care provider will check your: Height and weight. These may be used to calculate your BMI (body mass index). BMI is a measurement that tells if you are at a healthy weight. Heart rate and blood pressure. Body temperature. Skin for abnormal spots. Counseling Your health care provider may ask you questions about your: Past medical problems. Family's medical history. Alcohol, tobacco, and drug use. Emotional well-being. Home life and relationship well-being. Sexual activity. Diet, exercise, and sleep habits. Work  and work environment. Access to firearms. What immunizations do I need? Vaccines are usually given at various ages, according to a schedule. Your health care provider will recommend vaccines for you based on your age, medical history, and lifestyle or other factors, such as travel or where you  work. What tests do I need? Blood tests Lipid and cholesterol levels. These may be checked every 5 years, or more often if you are over 36 years old. Hepatitis C test. Hepatitis B test. Screening Lung cancer screening. You may have this screening every year starting at age 103 if you have a 30-pack-year history of smoking and currently smoke or have quit within the past 15 years. Prostate cancer screening. Recommendations will vary depending on your family history and other risks. Genital exam to check for testicular cancer or hernias. Colorectal cancer screening. All adults should have this screening starting at age 60 and continuing until age 53. Your health care provider may recommend screening at age 7 if you are at increased risk. You will have tests every 1-10 years, depending on your results and the type of screening test. Diabetes screening. This is done by checking your blood sugar (glucose) after you have not eaten for a while (fasting). You may have this done every 1-3 years. STD (sexually transmitted disease) testing, if you are at risk. Follow these instructions at home: Eating and drinking  Eat a diet that includes fresh fruits and vegetables, whole grains, lean protein, and low-fat dairy products. Take vitamin and mineral supplements as recommended by your health care provider. Do not drink alcohol if your health care provider tells you not to drink. If you drink alcohol: Limit how much you have to 0-2 drinks a day. Be aware of how much alcohol is in your drink. In the [place] one drink equals one 12 oz bottle of beer (355 mL), one 5 oz glass of wine (148 mL), or one 1 oz glass of hard liquor (44 mL). Lifestyle Take daily care of your teeth and gums. Brush your teeth every morning and night with fluoride toothpaste. Floss one time each day. Stay active. Exercise for at least 30 minutes 5 or more days each week. Do not use any products that contain nicotine or tobacco, such  as cigarettes, e-cigarettes, and chewing tobacco. If you need help quitting, ask your health care provider. Do not use drugs. If you are sexually active, practice safe sex. Use a condom or other form of protection to prevent STIs (sexually transmitted infections). If told by your health care provider, take low-dose aspirin daily starting at age 79. Find healthy ways to cope with stress, such as: Meditation, yoga, or listening to music. Journaling. Talking to a trusted person. Spending time with friends and family. Safety Always wear your seat belt while driving or riding in a vehicle. Do not drive: If you have been drinking alcohol. Do not ride with someone who has been drinking. When you are tired or distracted. While texting. Wear a helmet and other protective equipment during sports activities. If you have firearms in your house, make sure you follow all gun safety procedures. What's next? Go to your health care provider once a year for an annual wellness visit. Ask your health care provider how often you should have your eyes and teeth checked. Stay up to date on all vaccines. This information is not intended to replace advice given to you by your health care provider. Make sure you discuss any questions you have with your  health care provider. Document Revised: 12/25/2020 Document Reviewed: 10/11/2018 Elsevier Patient Education  2022 [employer].

## 2021-08-30 NOTE — Progress Notes (Signed)
Subjective: VW:[id number] care, no concerns HPI: Daniel Wells is a 60 y.o. male presenting to clinic today for:  Daniel Wells comes today to establish care.  He has no medical concerns today but admits that he has not seen a doctor in several years.  His last colonoscopy was in 2012 and at that time he had a clean bill of health and was told that he could repeat in 10 years.  Fortunately now he is uninsured and wants to be cost conscious.  Does not report any rectal bleeding, unplanned weight loss, night sweats.  He is an active every day smoker and smokes 1 pack/day.  He started smoking when he was 60 years old and used to smoke anywhere between 2 and 3 packs/day.  No hemoptysis, change in breathing or exercise tolerance.  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  He eats 1 meal per day, typically late in the evening.  No chest pain, shortness of breath reported.  He did think that he had a testicular lump at 1 point but that has resolved.  He is not sexually active.  He does not report any change in urination including hematuria or change in stream.  No testicular swelling, pelvic pain, pelvic lymphadenopathy.   Past Medical History:  Diagnosis Date   Viral hepatitis B without mention of hepatic coma, acute or unspecified, without mention of hepatitis delta    Past Surgical History:  Procedure Laterality Date   TONSILLECTOMY     TONSILLECTOMY AND ADENOIDECTOMY     TYMPANOSTOMY TUBE PLACEMENT     Social History   Socioeconomic History   Marital status: Single    Spouse name: Not on file   Number of children: Not on file   Years of education: Not on file   Highest education level: Not on file  Occupational History   Not on file  Tobacco Use   Smoking status: Every Day    Packs/day: 1.00    Years: 46.00    Pack years: 46.00    Types: Cigarettes   Smokeless tobacco: Never  Substance and Sexual Activity   Alcohol use: No    Drug use: No   Sexual activity: Not Currently  Other Topics Concern   Not on file  Social History Narrative   Not on file   Social Determinants of Health   Financial Resource Strain: Not on file  Food Insecurity: Not on file  Transportation Needs: Not on file  Physical Activity: Sufficiently Active   Days of Exercise per Week: 7 days   Minutes of Exercise per Session: 60 min  Stress: Not on file  Social Connections: Not on file  Intimate Partner Violence: Not on file   No outpatient medications have been marked as taking for the 08/30/21 encounter (Office Visit) with Janora Norlander, DO.   Family History  Adopted: Yes   No Known Allergies   Health Maintenance: Colon cancer screening due, lung cancer screening due  ROS: Per HPI  Objective: Office vital signs reviewed. BP (!) 148/80   Pulse 85   Temp 97.7 F (36.5 C)   Ht _0  (1.778 m)   Wt 145 lb (65.8 kg)   SpO2 95%   BMI 20.81 kg/m   Physical Examination:  General: Awake, alert, well appearing male. Well nourished, No acute distress HEENT: Normal; sclera white.  Moist mucous membranes. Cardio: regular rate and rhythm, S1S2 heard, no murmurs appreciated Pulm: clear  to auscultation bilaterally, no wheezes, rhonchi or rales; normal work of breathing on room air GI: soft, non-tender, non-distended, bowel sounds present x4, no hepatomegaly, no splenomegaly, no masses Extremities: warm, well perfused, No edema, cyanosis or clubbing; +2 pulses bilaterally MSK: norml gait and station Skin: dry; intact; no rashes or lesions; normal temperature Neuro: No tremor Psych: Pleasant, interactive.  Depression screen PHQ 2/9 08/30/2021  Decreased Interest 0  Down, Depressed, Hopeless 0  PHQ - 2 Score 0    Assessment/ Plan: 60 y.o. male   Heavy smoker (more than 20 cigarettes per day) - Plan: CMP14+EGFR, CBC  Elevated blood pressure reading in office without diagnosis of hypertension - Plan:  CMP14+EGFR  Establishing care with new doctor, encounter for  Screen for colon cancer - Plan: Fecal occult blood, imunochemical  Screening for malignant neoplasm of prostate - Plan: PSA  Screening cholesterol level - Plan: Lipid Panel  Screening for metabolic disorder - Plan: CMP14+EGFR  Counseled.  Patient has has actually reduced his cigarette intake by 2 packs/day.  He is a greater than 30-pack-year smoker and we discussed that technically he qualifies for low-dose CAT scan for lung cancer screening but I think this would be very financially straining at this time given uninsured status.  He demonstrates no concerning symptoms or signs suggestive of lung cancer.  Blood pressure noted to be elevated.  No diagnosis of high blood pressure.  Tobacco use contributing but would like to have him be rechecked by nurse in 2 weeks and if persistently elevated at that time, we can consider starting Norvasc for treatment  Last colonoscopy greater than 10 years ago and was negative.  We will collect FOBT for screening purposes that is cost conscious.  Screening PSA, lipid panel and CMP ordered as well.  We will contact patient with results once available.  Anticipate once yearly checkup unless blood pressure noted to be persistently elevated  Janora Norlander, [place] [phone]

## 2021-08-31 LAB — CMP14+EGFR
ALT: 8 IU/L (ref 0–44)
AST: 17 IU/L (ref 0–40)
Albumin/Globulin Ratio: 2.5 — ABNORMAL HIGH (ref 1.2–2.2)
Albumin: 4.8 g/dL (ref 3.8–4.8)
Alkaline Phosphatase: 119 IU/L (ref 44–121)
BUN/Creatinine Ratio: 9 — ABNORMAL LOW (ref 10–24)
BUN: 9 mg/dL (ref 8–27)
Bilirubin Total: 0.7 mg/dL (ref 0.0–1.2)
CO2: 27 mmol/L (ref 20–29)
Calcium: 10.4 mg/dL — ABNORMAL HIGH (ref 8.6–10.2)
Chloride: 102 mmol/L (ref 96–106)
Creatinine, Ser: 0.98 mg/dL (ref 0.76–1.27)
Globulin, Total: 1.9 g/dL (ref 1.5–4.5)
Glucose: 85 mg/dL (ref 70–99)
Potassium: 4.9 mmol/L (ref 3.5–5.2)
Sodium: 141 mmol/L (ref 134–144)
Total Protein: 6.7 g/dL (ref 6.0–8.5)
eGFR: 87 mL/min/{1.73_m2} (ref >59)

## 2021-08-31 LAB — CBC
Hematocrit: 49.9 % (ref 37.5–51.0)
Hemoglobin: 16.9 g/dL (ref 13.0–17.7)
MCH: 30.7 pg (ref 26.6–33.0)
MCHC: 33.9 g/dL (ref 31.5–35.7)
MCV: 91 fL (ref 79–97)
Platelets: 221 10*3/uL (ref 150–450)
RBC: 5.51 x10E6/uL (ref 4.14–5.80)
RDW: 12.3 % (ref 11.6–15.4)
WBC: 9.1 10*3/uL (ref 3.4–10.8)

## 2021-08-31 LAB — PSA: Prostate Specific Ag, Serum: 0.5 ng/mL (ref 0.0–4.0)

## 2021-08-31 LAB — LIPID PANEL
Chol/HDL Ratio: 5.4 ratio — ABNORMAL HIGH (ref 0.0–5.0)
Cholesterol, Total: 198 mg/dL (ref 100–199)
HDL: 37 mg/dL — ABNORMAL LOW (ref >39)
LDL Chol Calc (NIH): 139 mg/dL — ABNORMAL HIGH (ref 0–99)
Triglycerides: 122 mg/dL (ref 0–149)
VLDL Cholesterol Cal: 22 mg/dL (ref 5–40)

## 2023-02-14 ENCOUNTER — Telehealth: Payer: Self-pay

## 2023-02-14 NOTE — Transitions of Care (Post Inpatient/ED Visit) (Signed)
   02/14/2023  Name: Daniel Wells MRN: [id number] DOB: 1961/07/22  Today's TOC FU Call Status: Today's TOC FU Call Status:: Successful TOC FU Call Competed TOC FU Call Complete Date: 02/14/23  Transition Care Management Follow-up Telephone Call Date of Discharge: 02/13/23 Discharge Facility: Other ([facility]) Name of Other ([facility]) Discharge Facility: [facility] Type of Discharge: Emergency Department Reason for ED Visit: Other:, Cardiac Conditions (puncture wound right knee) How have you been since you were released from the hospital?: Same Any questions or concerns?: No  Items Reviewed: Did you receive and understand the discharge instructions provided?: Yes Medications obtained and verified?: Yes (Medications Reviewed) Any new allergies since your discharge?: No Dietary orders reviewed?: Yes Do you have support at home?: Yes People in Home: significant other  Home Care and Equipment/Supplies: Were Home Health Services Ordered?: NA Any new equipment or medical supplies ordered?: NA  Functional Questionnaire: Do you need assistance with bathing/showering or dressing?: No Do you need assistance with meal preparation?: No Do you need assistance with eating?: No Do you have difficulty maintaining continence: No Do you need assistance with getting out of bed/getting out of a chair/moving?: No Do you have difficulty managing or taking your medications?: No  Follow up appointments reviewed: PCP Follow-up appointment confirmed?: No (no avail appt times, sent message to staff to schedule) MD Provider Line [phone] Given: No Specialist Hospital Follow-up appointment confirmed?: NA Do you need transportation to your follow-up appointment?: No Do you understand care options if your condition(s) worsen?: Yes-patient verbalized understanding    SIGNATURE Karena Addison, LPN [facility] Nurse Health Advisor Direct Dial [phone]

## 2023-02-21 ENCOUNTER — Telehealth (INDEPENDENT_AMBULATORY_CARE_PROVIDER_SITE_OTHER): Payer: Self-pay | Admitting: Family

## 2023-02-21 ENCOUNTER — Encounter: Payer: Self-pay | Admitting: Family

## 2023-02-21 DIAGNOSIS — G500 Trigeminal neuralgia: Secondary | ICD-10-CM

## 2023-02-21 MED ORDER — CARBAMAZEPINE 200 MG PO TABS: 200.0000 mg | ORAL_TABLET | Freq: Two times a day (BID) | ORAL | 0 refills | Status: DC

## 2023-02-21 MED ORDER — GABAPENTIN 100 MG PO CAPS
100.0000 mg | ORAL_CAPSULE | Freq: Three times a day (TID) | ORAL | 0 refills | Status: DC | PRN
Start: 2023-02-21 — End: 2024-03-01

## 2023-02-21 NOTE — Patient Instructions (Signed)
Trigeminal Neuralgia  Trigeminal neuralgia is a nerve disorder that causes severe pain on one side of the face. The pain may last from a few seconds to several minutes, but it can happen hundreds of times a day. The pain is usually only on one side of the face. Symptoms may occur for days, weeks, or months and then go away for months or years. The pain may return and be worse than before. What are the causes? This condition may be caused by: Damage or pressure to a nerve in the head that is called the trigeminal nerve. An attack can be triggered by: Talking or chewing. Putting on makeup. Washing, shaving, or touching your face. Brushing your teeth. Blasts of hot or cold air. Primary demyelinating disorders, such as multiple sclerosis. Tumors. What increases the risk? You are more likely to develop this condition if: You are 50-60 years old. You are male. What are the signs or symptoms? The main symptom of this condition is severe pain in the jaw, lips, eyes, nose, scalp, forehead, and face. How is this diagnosed? This condition is diagnosed with a physical exam. A CT scan or an MRI may be done to rule out other conditions that can cause facial pain. How is this treated? This condition may be treated with: Measures to avoid the things that trigger your symptoms. Prescription medicines such as anticonvulsants. Procedures such as ablation, thermal, or radiation therapy. Cognitive or behavioral therapy. Complementary therapies such as: Gentle, regular exercise or yoga. Meditation. Aromatherapy. Acupuncture. Surgery. This may be done in severe cases if other medical treatment does not provide relief. It may take up to one month for treatment to start relieving the pain. Follow these instructions at home: Managing pain  Learn as much as you can about how to manage your pain. Ask your health care provider if a pain specialist would be helpful. Consider talking with a mental health  care provider about how to cope with the pain. Consider joining a pain support group. General instructions Take over-the-counter and prescription medicines only as told by your health care provider. Avoid the things that trigger your symptoms. It may help to: Chew on the unaffected side of your mouth. Avoid touching your face. Avoid blasts of hot or cold air. Keep all follow-up visits. Where to find more information Facial Pain Association: facepain.org Contact a health care provider if: Your medicine is not helping your symptoms. You have side effects from the medicine used for treatment. You develop new, unexplained symptoms, such as: Double vision. Facial weakness or numbness. Changes in hearing or balance. You feel depressed. Get help right away if: Your pain is severe and is not getting better. You develop suicidal thoughts. If you ever feel like you may hurt yourself or others, or have thoughts about taking your own life, get help right away. Go to your nearest emergency department or: Call your local emergency services (911 in the [place] Call a suicide crisis helpline, such as the National Suicide Prevention Lifeline at 1-800-273-8255 or 988 in the [place] This is open 24 hours a day in the [place] Text the Crisis Text Line at 741741 (in the [place] Summary Trigeminal neuralgia is a nerve disorder that causes severe pain on one side of the face. The pain may last from a few seconds to several minutes. This condition is caused by damage or pressure to a nerve in the head that is called the trigeminal nerve. Treatment may include avoiding the things that trigger your symptoms,   taking medicines, or having procedures or surgery. It may take up to one month for treatment to start relieving the pain. Keep all follow-up visits. This information is not intended to replace advice given to you by your health care provider. Make sure you discuss any questions you have with your health care  provider. Document Revised: 05/13/2021 Document Reviewed: 04/12/2021 Elsevier Patient Education  2023 Elsevier Inc.  

## 2023-02-21 NOTE — Progress Notes (Signed)
Virtual Visit Consent   Daniel Wells, you are scheduled for a virtual visit with a [facility] provider today. Just as with appointments in the office, your consent must be obtained to participate. Your consent will be active for this visit and any virtual visit you may have with one of our providers in the next 365 days. If you have a MyChart account, a copy of this consent can be sent to you electronically.  As this is a virtual visit, video technology does not allow for your provider to perform a traditional examination. This may limit your provider's ability to fully assess your condition. If your provider identifies any concerns that need to be evaluated in person or the need to arrange testing (such as labs, EKG, etc.), we will make arrangements to do so. Although advances in technology are sophisticated, we cannot ensure that it will always work on either your end or our end. If the connection with a video visit is poor, the visit may have to be switched to a telephone visit. With either a video or telephone visit, we are not always able to ensure that we have a secure connection.  By engaging in this virtual visit, you consent to the provision of healthcare and authorize for your insurance to be billed (if applicable) for the services provided during this visit. Depending on your insurance coverage, you may receive a charge related to this service.  I need to obtain your verbal consent now. Are you willing to proceed with your visit today? Daniel Wells has provided verbal consent on 02/21/2023 for a virtual visit (video or telephone). Jannifer Rodney, FNP  Date: 02/21/2023 10:39 AM  Virtual Visit via Video Note   I, Jannifer Rodney, connected with  Daniel Wells  ([id number], Jan 05, 1961) on 02/21/23 at  5:45 PM EDT by a video-enabled telemedicine application and verified that I am speaking with the correct person using two identifiers.  Location: Patient: Virtual Visit Location Patient:  Home Provider: Virtual Visit Location Provider: Office/Clinic   I discussed the limitations of evaluation and management by telemedicine and the availability of in person appointments. The patient expressed understanding and agreed to proceed.    History of Present Illness: Daniel Wells is a 62 y.o. who identifies as a male who was assigned male at birth, and is being seen today for trigeminal neuralgia. He reports he will have pain left facial area that is a 10 out 10 that lasts few seconds when eating, speaking, brushing his teeth, or shaving his face. However, on Sunday reports the pain 10-15 seconds and was on the ground in pain. He went to Urgent Care and was started on carbamazepine 100 MG BID. State he has had no relief.   HPI: HPI  Problems:  Patient Active Problem List   Diagnosis Date Noted   Diarrhea 06/23/2015   Routine general medical examination at a health care facility 06/12/2014   Hematuria, gross 06/12/2014   Hepatitis B virus infection 11/17/2010   TOBACCO USE 11/17/2010    Allergies: No Known Allergies Medications:  Current Outpatient Medications:    carbamazepine (TEGRETOL) 200 MG tablet, Take 1 tablet (200 mg total) by mouth in the morning and at bedtime., Disp: 180 tablet, Rfl: 0   gabapentin (NEURONTIN) 100 MG capsule, Take 1 capsule (100 mg total) by mouth 3 (three) times daily as needed., Disp: 90 capsule, Rfl: 0   sulfamethoxazole-trimethoprim (BACTRIM DS) 800-160 MG tablet, Take 1 tablet by mouth 2 (two) times daily., Disp: , Rfl:  Observations/Objective: Patient is well-developed, well-nourished in no acute distress.  Resting comfortably  at home.  Head is normocephalic, atraumatic.  No labored breathing.  Speech is clear and coherent with logical content.  Patient is alert and oriented at baseline.    Assessment and Plan: 1. Trigeminal neuralgia - Ambulatory referral to Neurology - carbamazepine (TEGRETOL) 200 MG tablet; Take 1 tablet (200 mg  total) by mouth in the morning and at bedtime.  Dispense: 180 tablet; Refill: 0 - gabapentin (NEURONTIN) 100 MG capsule; Take 1 capsule (100 mg total) by mouth 3 (three) times daily as needed.  Dispense: 90 capsule; Refill: 0  Will increase tegretol to 200 mg BID from 100 mg  Will give gabapentin 100 mg TID  Referral to Neurologists pending Avoid chewing on left side and blasts of hot or cold air Follow up if symptoms worsen or do not improve    Follow Up Instructions: I discussed the assessment and treatment plan with the patient. The patient was provided an opportunity to ask questions and all were answered. The patient agreed with the plan and demonstrated an understanding of the instructions.  A copy of instructions were sent to the patient via MyChart unless otherwise noted below.     The patient was advised to call back or seek an in-person evaluation if the symptoms worsen or if the condition fails to improve as anticipated.  Time:  I spent 13 minutes with the patient via telehealth technology discussing the above problems/concerns.    Jannifer Rodney, FNP

## 2023-02-24 ENCOUNTER — Telehealth: Payer: Self-pay | Admitting: Family Medicine

## 2023-02-24 DIAGNOSIS — G500 Trigeminal neuralgia: Secondary | ICD-10-CM

## 2023-02-24 NOTE — Telephone Encounter (Signed)
Patient calling because the referral for neuro was not received by [facility] Neurosurgery office. Please send to [phone]

## 2023-02-27 NOTE — Telephone Encounter (Signed)
I'm not totally sure.  This looks to be a visit with Neysa Bonito last week, as I was out of office.  I suspect she really does want Neurology because she diagnosed him with Trigeminal neuralgia but I will cc to her for clarification

## 2023-02-27 NOTE — Telephone Encounter (Signed)
Referral to Neurosurgeon placed as he already has an appointment on 03/01/23.

## 2023-04-20 ENCOUNTER — Other Ambulatory Visit: Payer: Self-pay | Admitting: Family

## 2023-04-20 DIAGNOSIS — G500 Trigeminal neuralgia: Secondary | ICD-10-CM

## 2023-07-21 ENCOUNTER — Telehealth: Payer: Self-pay | Admitting: Family Medicine

## 2023-07-21 NOTE — Telephone Encounter (Signed)
Pt called stating that his stool has been very soft lately and wants to know if that could be coming from any of the meds that he's taking or if provider thinks it could be something else?   Can covering provider address this?

## 2023-07-21 NOTE — Telephone Encounter (Signed)
Pt c/o changes in stool-denies blood in stool. Advised pt he ntbs as he hasn't been seen in office since 2022 and we don't have any recent labs etc so pt scheduled for CPE with PCP 10/22 at 10.

## 2023-08-22 ENCOUNTER — Telehealth: Payer: Self-pay | Admitting: Family Medicine

## 2023-08-22 ENCOUNTER — Encounter: Payer: Self-pay | Admitting: Family Medicine

## 2023-08-22 ENCOUNTER — Ambulatory Visit: Payer: Self-pay | Admitting: Family Medicine

## 2023-08-22 VITALS — BP 125/78 | HR 82 | Temp 98.7°F | Ht 70.0 in | Wt 145.6 lb

## 2023-08-22 DIAGNOSIS — G500 Trigeminal neuralgia: Secondary | ICD-10-CM

## 2023-08-22 DIAGNOSIS — Z5181 Encounter for therapeutic drug level monitoring: Secondary | ICD-10-CM

## 2023-08-22 DIAGNOSIS — R195 Other fecal abnormalities: Secondary | ICD-10-CM

## 2023-08-22 LAB — CBC WITH DIFFERENTIAL/PLATELET
Basophils Absolute: 0.1 10*3/uL (ref 0.0–0.2)
Basos: 1 %
EOS (ABSOLUTE): 0.2 10*3/uL (ref 0.0–0.4)
Eos: 2 %
Hematocrit: 49.4 % (ref 37.5–51.0)
Hemoglobin: 16.6 g/dL (ref 13.0–17.7)
Immature Grans (Abs): 0 10*3/uL (ref 0.0–0.1)
Immature Granulocytes: 0 %
Lymphocytes Absolute: 2.3 10*3/uL (ref 0.7–3.1)
Lymphs: 27 %
MCH: 31.9 pg (ref 26.6–33.0)
MCHC: 33.6 g/dL (ref 31.5–35.7)
MCV: 95 fL (ref 79–97)
Monocytes Absolute: 0.5 10*3/uL (ref 0.1–0.9)
Monocytes: 6 %
Neutrophils Absolute: 5.6 10*3/uL (ref 1.4–7.0)
Neutrophils: 64 %
Platelets: 229 10*3/uL (ref 150–450)
RBC: 5.2 x10E6/uL (ref 4.14–5.80)
RDW: 12.7 % (ref 11.6–15.4)
WBC: 8.7 10*3/uL (ref 3.4–10.8)

## 2023-08-22 LAB — CMP14+EGFR
ALT: 15 [IU]/L (ref 0–44)
AST: 17 [IU]/L (ref 0–40)
Albumin: 4.6 g/dL (ref 3.9–4.9)
Alkaline Phosphatase: 137 [IU]/L — ABNORMAL HIGH (ref 44–121)
BUN/Creatinine Ratio: 15 (ref 10–24)
BUN: 11 mg/dL (ref 8–27)
Bilirubin Total: 0.3 mg/dL (ref 0.0–1.2)
CO2: 25 mmol/L (ref 20–29)
Calcium: 10.4 mg/dL — ABNORMAL HIGH (ref 8.6–10.2)
Chloride: 102 mmol/L (ref 96–106)
Creatinine, Ser: 0.75 mg/dL — ABNORMAL LOW (ref 0.76–1.27)
Globulin, Total: 2.2 g/dL (ref 1.5–4.5)
Glucose: 100 mg/dL — ABNORMAL HIGH (ref 70–99)
Potassium: 4.7 mmol/L (ref 3.5–5.2)
Sodium: 143 mmol/L (ref 134–144)
Total Protein: 6.8 g/dL (ref 6.0–8.5)
eGFR: 101 mL/min/{1.73_m2} (ref >59)

## 2023-08-22 MED ORDER — CARBAMAZEPINE 200 MG PO TABS
200.0000 mg | ORAL_TABLET | Freq: Four times a day (QID) | ORAL | 3 refills | Status: DC
Start: 2023-08-22 — End: 2023-08-23

## 2023-08-22 NOTE — Telephone Encounter (Signed)
Pt send carbamazepine (TEGRETOL) 200 MG tablet  to [facility] in [place] on [address]

## 2023-08-22 NOTE — Patient Instructions (Signed)
Carbamazepine can cause constipation but not loose stools.  Definitely want you to be set up for a colonoscopy when you are able to do so.  Plan to fast for your visit in April.

## 2023-08-22 NOTE — Progress Notes (Signed)
Subjective: CC: Change in stool PCP: Raliegh Ip, DO ZOX:WRUEAVW Emert is a 62 y.o. male presenting to clinic today for:  1.  Change in stool/ Trigeminal neuralgia Patient reports he has had intermittent changes in stool ever since he was on antibiotics back in the spring.  He tries to follow a fairly balanced diet and has even tried using probiotics but nothing really has stabilized his gut.  Sometimes he will have normal stools but sometimes will be really loose.  He was not sure if the carbamazepine that he was placed on for his trigeminal neuralgia that was causing her not.  He denies any blood in stool.  No nausea, vomiting or abdominal pain.  He has had to change his diet a little bit when he has these trigeminal neuralgia flareups because he simply cannot open his mouth wide enough to get food and without significant pain.  He is currently taking Tegretol 200 mg 4 times daily for pain.  Has tried up to 300 mg 3 times daily of gabapentin and all it did is make him sleepy but it really never controlled the pain.  He is hoping that after he gets insurance next year he can get on with getting what ever he needs done to resolve this issue.  He is closely followed by Dr. Danielle Dess for this   ROS: Per HPI  No Known Allergies Past Medical History:  Diagnosis Date   Viral hepatitis B without mention of hepatic coma, acute or unspecified, without mention of hepatitis delta     Current Outpatient Medications:    gabapentin (NEURONTIN) 100 MG capsule, Take 1 capsule (100 mg total) by mouth 3 (three) times daily as needed., Disp: 90 capsule, Rfl: 0   carbamazepine (TEGRETOL) 200 MG tablet, Take 1 tablet (200 mg total) by mouth 4 (four) times daily., Disp: 360 tablet, Rfl: 3 Social History   Socioeconomic History   Marital status: Single    Spouse name: Not on file   Number of children: Not on file   Years of education: Not on file   Highest education level: Not on file  Occupational  History   Not on file  Tobacco Use   Smoking status: Every Day    Current packs/day: 1.00    Average packs/day: 1 pack/day for 46.0 years (46.0 ttl pk-yrs)    Types: Cigarettes   Smokeless tobacco: Never  Substance and Sexual Activity   Alcohol use: No   Drug use: No   Sexual activity: Not Currently  Other Topics Concern   Not on file  Social History Narrative   Not on file   Social Determinants of Health   Financial Resource Strain: Not on file  Food Insecurity: Not on file  Transportation Needs: Not on file  Physical Activity: Sufficiently Active (08/30/2021)   Exercise Vital Sign    Days of Exercise per Week: 7 days    Minutes of Exercise per Session: 60 min  Stress: Not on file  Social Connections: Not on file  Intimate Partner Violence: Not on file   Family History  Adopted: Yes    Objective: Office vital signs reviewed. BP 125/78   Pulse 82   Temp 98.7 F (37.1 C)   Ht 5\' 10"  (1.778 m)   Wt 145 lb 9.6 oz (66 kg)   SpO2 97%   BMI 20.89 kg/m   Physical Examination:  General: Awake, alert, thin, well-appearing male, No acute distress HEENT: No facial drooping.  Sclera white  Cardio: regular rate and rhythm Pulm: normal work of breathing on room air GI: Flat, nondistended Neuro: Alert and oriented.  No facial drooping.  EOMI, speech normal  Assessment/ Plan: 62 y.o. male   Change in consistency of stool  Trigeminal neuralgia - Plan: CMP14+EGFR, CBC with Differential, carbamazepine (TEGRETOL) 200 MG tablet  Encounter for medication monitoring - Plan: CMP14+EGFR, CBC with Differential  Suspect the change in stool is unlikely due to change in diet in the setting of intermittently active trigeminal neuralgia.  I have renewed his Tegretol with 4 times daily dosing that was recommended by his specialist.  Will check CMP and CBC.  Plan for full fasting labs and plan to get him caught up on all hips preventative care tasks after he is insured in the spring per  his request.  Certainly would like to get him a colonoscopy since he is past due on this and is a an active smoker   Torey Regan Hulen Skains, DO [facility] [phone]

## 2023-08-23 ENCOUNTER — Other Ambulatory Visit: Payer: Self-pay

## 2023-08-23 DIAGNOSIS — G500 Trigeminal neuralgia: Secondary | ICD-10-CM

## 2023-08-23 MED ORDER — CARBAMAZEPINE 200 MG PO TABS: 200.0000 mg | ORAL_TABLET | Freq: Four times a day (QID) | ORAL | 3 refills | Status: DC

## 2023-08-23 NOTE — Telephone Encounter (Signed)
sent 

## 2024-01-01 ENCOUNTER — Telehealth: Payer: Self-pay | Admitting: Family Medicine

## 2024-01-01 NOTE — Telephone Encounter (Signed)
 Is this being prescribed by his neuro Dr Danielle Dess?  We haven't prescribed this dose to my knowledge

## 2024-01-01 NOTE — Telephone Encounter (Signed)
 Copied from CRM [id number]. Topic: Clinical - Medication Refill >> Jan 01, 2024 12:33 PM Dennison Nancy wrote: Most Recent Primary Care Visit:  Provider: Raliegh Ip  Department: [facility]  Visit Type: PHYSICAL  Date: 08/22/2023  Medication: gabapentin 600 mg   Has the patient contacted their pharmacy? No (Agent: If no, request that the patient contact the pharmacy for the refill. If patient does not wish to contact the pharmacy document the reason why and proceed with request.) (Agent: If yes, when and what did the pharmacy advise?)  Is this the correct pharmacy for this prescription? Yes If no, delete pharmacy and type the correct one.  This is the patient's preferred pharmacy:  [id number] [place], [place] - [address] AT [facility] OF [facility] & [address] [place] [address] Phone: [phone] Fax: [phone]   Has the prescription been filled recently? No  Is the patient out of the medication? No , patient has on 2 which today is the last day   Has the patient been seen for an appointment in the last year OR does the patient have an upcoming appointment? Yes  Can we respond through MyChart? No  Agent: Please be advised that Rx refills may take up to 3 business days. We ask that you follow-up with your pharmacy.

## 2024-01-01 NOTE — Telephone Encounter (Signed)
 Left detailed message about refill

## 2024-02-27 DIAGNOSIS — G500 Trigeminal neuralgia: Secondary | ICD-10-CM | POA: Diagnosis not present

## 2024-02-29 ENCOUNTER — Other Ambulatory Visit: Payer: Self-pay | Admitting: Neurological Surgery

## 2024-03-05 NOTE — Pre-Procedure Instructions (Signed)
 Surgical Instructions   Your procedure is scheduled on Mar 14, 2024. Report to [facility] Main Entrance "A" at 8:30 A.M., then check in with the Admitting office. Any questions or running late day of surgery: call [phone]  Questions prior to your surgery date: call [phone], Monday-Friday, 8am-4pm. If you experience any cold or flu symptoms such as cough, fever, chills, shortness of breath, etc. between now and your scheduled surgery, please notify us  at the above number.     Remember:  Do not eat or drink after midnight the night before your surgery   Take these medicines the morning of surgery with A SIP OF WATER: carbamazepine  (TEGRETOL )  gabapentin  (NEURONTIN )    One week prior to surgery, STOP taking any Aspirin (unless otherwise instructed by your surgeon) Aleve, Naproxen, Ibuprofen, Motrin, Advil, Goody's, BC's, all herbal medications, fish oil, and non-prescription vitamins.                     Do NOT Smoke (Tobacco/Vaping) for 24 hours prior to your procedure.  If you use a CPAP at night, you may bring your mask/headgear for your overnight stay.   You will be asked to remove any contacts, glasses, piercing's, hearing aid's, dentures/partials prior to surgery. Please bring cases for these items if needed.    Patients discharged the day of surgery will not be allowed to drive home, and someone needs to stay with them for 24 hours.  SURGICAL WAITING ROOM VISITATION Patients may have no more than 2 support people in the waiting area - these visitors may rotate.   Pre-op nurse will coordinate an appropriate time for 1 ADULT support person, who may not rotate, to accompany patient in pre-op.  Children under the age of 65 must have an adult with them who is not the patient and must remain in the main waiting area with an adult.  If the patient needs to stay at the hospital during part of their recovery, the visitor guidelines for inpatient rooms apply.  Please refer  to the [facility] website for the visitor guidelines for any additional information.   If you received a COVID test during your pre-op visit  it is requested that you wear a mask when out in public, stay away from anyone that may not be feeling well and notify your surgeon if you develop symptoms. If you have been in contact with anyone that has tested positive in the last 10 days please notify you surgeon.      Pre-operative CHG Bathing Instructions   You can play a key role in reducing the risk of infection after surgery. Your skin needs to be as free of germs as possible. You can reduce the number of germs on your skin by washing with CHG (chlorhexidine gluconate) soap before surgery. CHG is an antiseptic soap that kills germs and continues to kill germs even after washing.   DO NOT use if you have an allergy to chlorhexidine/CHG or antibacterial soaps. If your skin becomes reddened or irritated, stop using the CHG and notify one of our RNs at [phone].              TAKE A SHOWER THE NIGHT BEFORE SURGERY AND THE DAY OF SURGERY    Please keep in mind the following:  DO NOT shave, including legs and underarms, 48 hours prior to surgery.   You may shave your face before/day of surgery.  Place clean sheets on your bed the night before  surgery Use a clean washcloth (not used since being washed) for each shower. DO NOT sleep with pet's night before surgery.  CHG Shower Instructions:  Wash your face and private area with normal soap. If you choose to wash your hair, wash first with your normal shampoo.  After you use shampoo/soap, rinse your hair and body thoroughly to remove shampoo/soap residue.  Turn the water OFF and apply half the bottle of CHG soap to a CLEAN washcloth.  Apply CHG soap ONLY FROM YOUR NECK DOWN TO YOUR TOES (washing for 3-5 minutes)  DO NOT use CHG soap on face, private areas, open wounds, or sores.  Pay special attention to the area where your surgery is being  performed.  If you are having back surgery, having someone wash your back for you may be helpful. Wait 2 minutes after CHG soap is applied, then you may rinse off the CHG soap.  Pat dry with a clean towel  Put on clean pajamas    Additional instructions for the day of surgery: DO NOT APPLY any lotions, deodorants, cologne, or perfumes.   Do not wear jewelry or makeup Do not wear nail polish, gel polish, artificial nails, or any other type of covering on natural nails (fingers and toes) Do not bring valuables to the hospital. [facility] is not responsible for valuables/personal belongings. Put on clean/comfortable clothes.  Please brush your teeth.  Ask your nurse before applying any prescription medications to the skin.

## 2024-03-06 ENCOUNTER — Encounter (HOSPITAL_COMMUNITY)
Admission: RE | Admit: 2024-03-06 | Discharge: 2024-03-06 | Disposition: A | Discharge status: Home / Self Care | Source: Ambulatory Visit | Attending: Neurological Surgery | Admitting: Neurological Surgery

## 2024-03-06 ENCOUNTER — Other Ambulatory Visit: Payer: Self-pay

## 2024-03-06 ENCOUNTER — Encounter (HOSPITAL_COMMUNITY): Payer: Self-pay

## 2024-03-06 VITALS — BP 143/88 | HR 63 | Temp 97.9°F | Resp 18 | Ht 70.0 in | Wt 145.0 lb

## 2024-03-06 DIAGNOSIS — Z01812 Encounter for preprocedural laboratory examination: Secondary | ICD-10-CM | POA: Insufficient documentation

## 2024-03-06 DIAGNOSIS — K759 Inflammatory liver disease, unspecified: Secondary | ICD-10-CM | POA: Insufficient documentation

## 2024-03-06 DIAGNOSIS — Z01818 Encounter for other preprocedural examination: Secondary | ICD-10-CM

## 2024-03-06 HISTORY — DX: Personal history of urinary calculi: Z87.442

## 2024-03-06 HISTORY — DX: Myoneural disorder, unspecified: G70.9

## 2024-03-06 LAB — CBC
HCT: 46.3 % (ref 39.0–52.0)
Hemoglobin: 15.8 g/dL (ref 13.0–17.0)
MCH: 32.4 pg (ref 26.0–34.0)
MCHC: 34.1 g/dL (ref 30.0–36.0)
MCV: 94.9 fL (ref 80.0–100.0)
Platelets: 205 10*3/uL (ref 150–400)
RBC: 4.88 MIL/uL (ref 4.22–5.81)
RDW: 12.2 % (ref 11.5–15.5)
WBC: 7.1 10*3/uL (ref 4.0–10.5)
nRBC: 0 % (ref 0.0–0.2)

## 2024-03-06 LAB — COMPREHENSIVE METABOLIC PANEL WITH GFR
ALT: 20 U/L (ref 0–44)
AST: 25 U/L (ref 15–41)
Albumin: 4.1 g/dL (ref 3.5–5.0)
Alkaline Phosphatase: 110 U/L (ref 38–126)
Anion gap: 7 (ref 5–15)
BUN: 15 mg/dL (ref 8–23)
CO2: 30 mmol/L (ref 22–32)
Calcium: 9.9 mg/dL (ref 8.9–10.3)
Chloride: 106 mmol/L (ref 98–111)
Creatinine, Ser: 1.24 mg/dL (ref 0.61–1.24)
GFR, Estimated: >60 mL/min (ref >60)
Glucose, Bld: 118 mg/dL — ABNORMAL HIGH (ref 70–99)
Potassium: 4.8 mmol/L (ref 3.5–5.1)
Sodium: 143 mmol/L (ref 135–145)
Total Bilirubin: 0.6 mg/dL (ref 0.0–1.2)
Total Protein: 7 g/dL (ref 6.5–8.1)

## 2024-03-06 LAB — TYPE AND SCREEN
ABO/RH(D): O POS
Antibody Screen: NEGATIVE

## 2024-03-06 NOTE — Progress Notes (Signed)
 PCP - Lindajean Res, DO Cardiologist - Denies  PPM/ICD - Denies Device Orders - n/a Rep Notified - n/a  Chest x-ray - Denies EKG - Denies Stress Test - Denies ECHO - Denies Cardiac Cath - Denies  Sleep Study - Denies CPAP - n/a  No DM  Last dose of GLP1 agonist- n/a GLP1 instructions: n/a  Blood Thinner Instructions: n/a Aspirin Instructions: n/a  NPO after midnight  COVID TEST- n/a   Anesthesia review: No.   Patient denies shortness of breath, fever, cough and chest pain at PAT appointment. Pt denies any respiratory illness/infection in the last two months.    All instructions explained to the patient, with a verbal understanding of the material. Patient agrees to go over the instructions while at home for a better understanding. Patient also instructed to self quarantine after being tested for COVID-19. The opportunity to ask questions was provided.

## 2024-03-13 MED ORDER — FENTANYL CITRATE (PF) 250 MCG/5ML IJ SOLN
INTRAMUSCULAR | Status: AC
  Filled 2024-03-13: qty 5

## 2024-03-13 MED ORDER — PROPOFOL 10 MG/ML IV BOLUS
INTRAVENOUS | Status: AC
  Filled 2024-03-13: qty 20

## 2024-03-13 MED ORDER — MIDAZOLAM HCL 2 MG/2ML IJ SOLN
INTRAMUSCULAR | Status: AC
  Filled 2024-03-13: qty 2

## 2024-03-13 NOTE — Anesthesia Preprocedure Evaluation (Signed)
 Anesthesia Evaluation  Patient identified by MRN, date of birth, ID band Patient awake    Reviewed: Allergy & Precautions, NPO status , Patient's Chart, lab work & pertinent test results  History of Anesthesia Complications Negative for: history of anesthetic complications  Airway Mallampati: II  TM Distance: >3 FB Neck ROM: Full    Dental  (+) Dental Advisory Given, Poor Dentition, Missing   Pulmonary neg shortness of breath, neg sleep apnea, neg COPD, neg recent URI, Current Smoker   Pulmonary exam normal breath sounds clear to auscultation       Cardiovascular negative cardio ROS  Rhythm:Regular Rate:Normal     Neuro/Psych neg Seizures  Neuromuscular disease (trigeminal neuralgia)    GI/Hepatic negative GI ROS,,,(+) Hepatitis - (2005), B  Endo/Other  negative endocrine ROS    Renal/GU negative Renal ROS     Musculoskeletal   Abdominal   Peds  Hematology negative hematology ROS (+) Lab Results      Component                Value               Date                      WBC                      7.1                 03/06/2024                HGB                      15.8                03/06/2024                HCT                      46.3                03/06/2024                MCV                      94.9                03/06/2024                PLT                      205                 03/06/2024              Anesthesia Other Findings   Reproductive/Obstetrics                             Anesthesia Physical Anesthesia Plan  ASA: 2  Anesthesia Plan: General   Post-op Pain Management: Tylenol PO (pre-op)*   Induction: Intravenous  PONV Risk Score and Plan: 1 and Ondansetron, Dexamethasone, Midazolam and Treatment may vary due to age or medical condition  Airway Management Planned: Oral ETT  Additional Equipment: Arterial line  Intra-op Plan:   Post-operative Plan:  Extubation in OR  Informed Consent: I have reviewed the patients History and  Physical, chart, labs and discussed the procedure including the risks, benefits and alternatives for the proposed anesthesia with the patient or authorized representative who has indicated his/her understanding and acceptance.     Dental advisory given  Plan Discussed with: CRNA and Anesthesiologist  Anesthesia Plan Comments: (Risks of general anesthesia discussed including, but not limited to, sore throat, hoarse voice, chipped/damaged teeth, injury to vocal cords, nausea and vomiting, allergic reactions, lung infection, heart attack, stroke, and death. All questions answered. )        Anesthesia Quick Evaluation

## 2024-03-14 ENCOUNTER — Inpatient Hospital Stay (HOSPITAL_COMMUNITY): Admitting: Anesthesiology

## 2024-03-14 ENCOUNTER — Encounter (HOSPITAL_COMMUNITY): Payer: Self-pay | Admitting: Neurological Surgery

## 2024-03-14 ENCOUNTER — Encounter (HOSPITAL_COMMUNITY)
Admission: RE | Disposition: A | Discharge status: Home / Self Care | Payer: Self-pay | Source: Home / Self Care | Attending: Neurological Surgery

## 2024-03-14 ENCOUNTER — Other Ambulatory Visit: Payer: Self-pay

## 2024-03-14 ENCOUNTER — Inpatient Hospital Stay (HOSPITAL_COMMUNITY)
Admission: RE | Admit: 2024-03-14 | Discharge: 2024-03-16 | DRG: 027 | Disposition: A | Discharge status: Home / Self Care | Payer: Self-pay | Attending: Neurological Surgery | Admitting: Neurological Surgery

## 2024-03-14 DIAGNOSIS — R110 Nausea: Secondary | ICD-10-CM | POA: Diagnosis not present

## 2024-03-14 DIAGNOSIS — M6258 Muscle wasting and atrophy, not elsewhere classified, other site: Secondary | ICD-10-CM | POA: Diagnosis present

## 2024-03-14 DIAGNOSIS — Z716 Tobacco abuse counseling: Secondary | ICD-10-CM

## 2024-03-14 DIAGNOSIS — Z8619 Personal history of other infectious and parasitic diseases: Secondary | ICD-10-CM | POA: Diagnosis not present

## 2024-03-14 DIAGNOSIS — Z87442 Personal history of urinary calculi: Secondary | ICD-10-CM

## 2024-03-14 DIAGNOSIS — Z79899 Other long term (current) drug therapy: Secondary | ICD-10-CM

## 2024-03-14 DIAGNOSIS — Z9889 Other specified postprocedural states: Secondary | ICD-10-CM

## 2024-03-14 DIAGNOSIS — G500 Trigeminal neuralgia: Secondary | ICD-10-CM | POA: Diagnosis not present

## 2024-03-14 DIAGNOSIS — F17200 Nicotine dependence, unspecified, uncomplicated: Principal | ICD-10-CM

## 2024-03-14 DIAGNOSIS — F17210 Nicotine dependence, cigarettes, uncomplicated: Secondary | ICD-10-CM | POA: Diagnosis present

## 2024-03-14 HISTORY — PX: CRANIECTOMY: SHX331

## 2024-03-14 LAB — ABO/RH: ABO/RH(D): O POS

## 2024-03-14 LAB — MRSA NEXT GEN BY PCR, NASAL: MRSA by PCR Next Gen: NOT DETECTED

## 2024-03-14 SURGERY — CRANIECTOMY FOR TIC DOULOUREUX
Anesthesia: General | Laterality: Left

## 2024-03-14 MED ORDER — DEXAMETHASONE SODIUM PHOSPHATE 10 MG/ML IJ SOLN
INTRAMUSCULAR | Status: DC | PRN
  Administered 2024-03-14: 10 mg via INTRAVENOUS

## 2024-03-14 MED ORDER — LIDOCAINE-EPINEPHRINE 1 %-1:100000 IJ SOLN
INTRAMUSCULAR | Status: AC
  Filled 2024-03-14: qty 1

## 2024-03-14 MED ORDER — 0.9 % SODIUM CHLORIDE (POUR BTL) OPTIME
TOPICAL | Status: DC | PRN
  Administered 2024-03-14: 2000 mL

## 2024-03-14 MED ORDER — PHENYLEPHRINE HCL-NACL 20-0.9 MG/250ML-% IV SOLN
INTRAVENOUS | Status: DC | PRN
  Administered 2024-03-14: 20 ug/min via INTRAVENOUS

## 2024-03-14 MED ORDER — AMISULPRIDE (ANTIEMETIC) 5 MG/2ML IV SOLN: 10.0000 mg | Freq: Once | INTRAVENOUS | Status: DC | PRN

## 2024-03-14 MED ORDER — BISACODYL 10 MG RE SUPP: 10.0000 mg | Freq: Every day | RECTAL | Status: DC | PRN

## 2024-03-14 MED ORDER — ACETAMINOPHEN 500 MG PO TABS
1000.0000 mg | ORAL_TABLET | Freq: Once | ORAL | Status: AC
  Administered 2024-03-14: 1000 mg via ORAL
  Filled 2024-03-14: qty 2

## 2024-03-14 MED ORDER — ORAL CARE MOUTH RINSE: 15.0000 mL | Freq: Once | OROMUCOSAL | Status: AC

## 2024-03-14 MED ORDER — ONDANSETRON HCL 4 MG/2ML IJ SOLN
INTRAMUSCULAR | Status: AC
  Filled 2024-03-14: qty 2

## 2024-03-14 MED ORDER — CHLORHEXIDINE GLUCONATE CLOTH 2 % EX PADS
6.0000 | MEDICATED_PAD | Freq: Every day | CUTANEOUS | Status: DC
  Administered 2024-03-14: 6 via TOPICAL

## 2024-03-14 MED ORDER — FLEET ENEMA RE ENEM: 1.0000 | ENEMA | Freq: Once | RECTAL | Status: DC | PRN

## 2024-03-14 MED ORDER — FENTANYL CITRATE (PF) 250 MCG/5ML IJ SOLN
INTRAMUSCULAR | Status: AC
  Filled 2024-03-14: qty 5

## 2024-03-14 MED ORDER — CEFAZOLIN SODIUM-DEXTROSE 2-4 GM/100ML-% IV SOLN
2.0000 g | INTRAVENOUS | Status: AC
  Administered 2024-03-14: 2 g via INTRAVENOUS
  Filled 2024-03-14: qty 100

## 2024-03-14 MED ORDER — GABAPENTIN 300 MG PO CAPS
600.0000 mg | ORAL_CAPSULE | Freq: Two times a day (BID) | ORAL | Status: DC
  Administered 2024-03-14 – 2024-03-16 (×4): 600 mg via ORAL
  Filled 2024-03-14 (×4): qty 2

## 2024-03-14 MED ORDER — GLYCOPYRROLATE 0.2 MG/ML IJ SOLN
INTRAMUSCULAR | Status: DC | PRN
  Administered 2024-03-14: .2 mg via INTRAVENOUS

## 2024-03-14 MED ORDER — LIDOCAINE 2% (20 MG/ML) 5 ML SYRINGE
INTRAMUSCULAR | Status: AC
  Filled 2024-03-14: qty 5

## 2024-03-14 MED ORDER — PHENYLEPHRINE 80 MCG/ML (10ML) SYRINGE FOR IV PUSH (FOR BLOOD PRESSURE SUPPORT)
PREFILLED_SYRINGE | INTRAVENOUS | Status: DC | PRN
Start: 2024-03-14 — End: 2024-03-14
  Administered 2024-03-14 (×2): 80 ug via INTRAVENOUS

## 2024-03-14 MED ORDER — THROMBIN 20000 UNITS EX SOLR: CUTANEOUS | Status: DC | PRN

## 2024-03-14 MED ORDER — BUPIVACAINE HCL (PF) 0.5 % IJ SOLN
INTRAMUSCULAR | Status: AC
  Filled 2024-03-14: qty 30

## 2024-03-14 MED ORDER — DEXAMETHASONE SODIUM PHOSPHATE 10 MG/ML IJ SOLN
INTRAMUSCULAR | Status: AC
  Filled 2024-03-14: qty 1

## 2024-03-14 MED ORDER — LACTATED RINGERS IV SOLN: INTRAVENOUS | Status: AC

## 2024-03-14 MED ORDER — ROCURONIUM BROMIDE 10 MG/ML (PF) SYRINGE
PREFILLED_SYRINGE | INTRAVENOUS | Status: AC
  Filled 2024-03-14: qty 10

## 2024-03-14 MED ORDER — THROMBIN 5000 UNITS EX SOLR: OROMUCOSAL | Status: DC | PRN

## 2024-03-14 MED ORDER — LIDOCAINE-EPINEPHRINE 1 %-1:100000 IJ SOLN
INTRAMUSCULAR | Status: DC | PRN
  Administered 2024-03-14: 5 mL

## 2024-03-14 MED ORDER — FENTANYL CITRATE (PF) 100 MCG/2ML IJ SOLN
25.0000 ug | INTRAMUSCULAR | Status: DC | PRN
  Administered 2024-03-14: 50 ug via INTRAVENOUS

## 2024-03-14 MED ORDER — MIDAZOLAM HCL 2 MG/2ML IJ SOLN
INTRAMUSCULAR | Status: AC
  Filled 2024-03-14: qty 2

## 2024-03-14 MED ORDER — FENTANYL CITRATE (PF) 250 MCG/5ML IJ SOLN
INTRAMUSCULAR | Status: DC | PRN
Start: 2024-03-14 — End: 2024-03-14
  Administered 2024-03-14: 50 ug via INTRAVENOUS
  Administered 2024-03-14: 100 ug via INTRAVENOUS
  Administered 2024-03-14 (×2): 50 ug via INTRAVENOUS

## 2024-03-14 MED ORDER — THROMBIN 5000 UNITS EX KIT
PACK | CUTANEOUS | Status: AC
  Filled 2024-03-14: qty 1

## 2024-03-14 MED ORDER — SENNA 8.6 MG PO TABS
1.0000 | ORAL_TABLET | Freq: Two times a day (BID) | ORAL | Status: DC
  Administered 2024-03-14 – 2024-03-15 (×2): 8.6 mg via ORAL
  Filled 2024-03-14 (×3): qty 1

## 2024-03-14 MED ORDER — CARBAMAZEPINE 200 MG PO TABS
200.0000 mg | ORAL_TABLET | Freq: Four times a day (QID) | ORAL | Status: DC
  Administered 2024-03-14 – 2024-03-16 (×7): 200 mg via ORAL
  Filled 2024-03-14 (×10): qty 1

## 2024-03-14 MED ORDER — MIDAZOLAM HCL 2 MG/2ML IJ SOLN
INTRAMUSCULAR | Status: DC | PRN
  Administered 2024-03-14: 2 mg via INTRAVENOUS

## 2024-03-14 MED ORDER — THROMBIN 20000 UNITS EX SOLR
CUTANEOUS | Status: AC
  Filled 2024-03-14: qty 20000

## 2024-03-14 MED ORDER — ONDANSETRON HCL 4 MG PO TABS: 4.0000 mg | ORAL_TABLET | ORAL | Status: DC | PRN

## 2024-03-14 MED ORDER — CHLORHEXIDINE GLUCONATE CLOTH 2 % EX PADS: 6.0000 | MEDICATED_PAD | Freq: Once | CUTANEOUS | Status: DC

## 2024-03-14 MED ORDER — BUPIVACAINE HCL (PF) 0.5 % IJ SOLN
INTRAMUSCULAR | Status: DC | PRN
  Administered 2024-03-14: 5 mL

## 2024-03-14 MED ORDER — NICOTINE 21 MG/24HR TD PT24: 21.0000 mg | MEDICATED_PATCH | Freq: Every day | TRANSDERMAL | Status: DC | PRN

## 2024-03-14 MED ORDER — OXYCODONE HCL 5 MG PO TABS: 5.0000 mg | ORAL_TABLET | Freq: Once | ORAL | Status: DC | PRN

## 2024-03-14 MED ORDER — HYDROMORPHONE HCL 1 MG/ML IJ SOLN: 0.5000 mg | INTRAMUSCULAR | Status: DC | PRN

## 2024-03-14 MED ORDER — OXYCODONE HCL 5 MG/5ML PO SOLN: 5.0000 mg | Freq: Once | ORAL | Status: DC | PRN

## 2024-03-14 MED ORDER — PROPOFOL 10 MG/ML IV BOLUS
INTRAVENOUS | Status: DC | PRN
  Administered 2024-03-14: 150 mg via INTRAVENOUS
  Administered 2024-03-14: 50 mg via INTRAVENOUS

## 2024-03-14 MED ORDER — GLYCOPYRROLATE PF 0.2 MG/ML IJ SOSY
PREFILLED_SYRINGE | INTRAMUSCULAR | Status: AC
  Filled 2024-03-14: qty 1

## 2024-03-14 MED ORDER — LABETALOL HCL 5 MG/ML IV SOLN: 10.0000 mg | INTRAVENOUS | Status: DC | PRN

## 2024-03-14 MED ORDER — SUGAMMADEX SODIUM 200 MG/2ML IV SOLN
INTRAVENOUS | Status: DC | PRN
  Administered 2024-03-14: 200 mg via INTRAVENOUS

## 2024-03-14 MED ORDER — BACITRACIN ZINC 500 UNIT/GM EX OINT
TOPICAL_OINTMENT | CUTANEOUS | Status: DC | PRN
  Administered 2024-03-14: 1 via TOPICAL

## 2024-03-14 MED ORDER — POLYETHYLENE GLYCOL 3350 17 G PO PACK: 17.0000 g | PACK | Freq: Every day | ORAL | Status: DC | PRN

## 2024-03-14 MED ORDER — SODIUM CHLORIDE 0.9 % IV SOLN: INTRAVENOUS | Status: DC | PRN

## 2024-03-14 MED ORDER — DOCUSATE SODIUM 100 MG PO CAPS
100.0000 mg | ORAL_CAPSULE | Freq: Two times a day (BID) | ORAL | Status: DC
  Administered 2024-03-14 – 2024-03-15 (×2): 100 mg via ORAL
  Filled 2024-03-14 (×3): qty 1

## 2024-03-14 MED ORDER — ONDANSETRON HCL 4 MG/2ML IJ SOLN
INTRAMUSCULAR | Status: DC | PRN
  Administered 2024-03-14 (×2): 4 mg via INTRAVENOUS

## 2024-03-14 MED ORDER — PROPOFOL 10 MG/ML IV BOLUS
INTRAVENOUS | Status: AC
  Filled 2024-03-14: qty 20

## 2024-03-14 MED ORDER — BACITRACIN ZINC 500 UNIT/GM EX OINT
TOPICAL_OINTMENT | CUTANEOUS | Status: AC
  Filled 2024-03-14: qty 28.35

## 2024-03-14 MED ORDER — DEXMEDETOMIDINE HCL IN NACL 80 MCG/20ML IV SOLN
INTRAVENOUS | Status: AC
  Filled 2024-03-14: qty 20

## 2024-03-14 MED ORDER — FENTANYL CITRATE (PF) 100 MCG/2ML IJ SOLN
INTRAMUSCULAR | Status: AC
  Filled 2024-03-14: qty 2

## 2024-03-14 MED ORDER — PANTOPRAZOLE SODIUM 40 MG IV SOLR: 40.0000 mg | Freq: Every day | INTRAVENOUS | Status: DC

## 2024-03-14 MED ORDER — ONDANSETRON HCL 4 MG/2ML IJ SOLN
4.0000 mg | INTRAMUSCULAR | Status: DC | PRN
  Administered 2024-03-14 – 2024-03-15 (×2): 4 mg via INTRAVENOUS
  Filled 2024-03-14 (×2): qty 2

## 2024-03-14 MED ORDER — ROCURONIUM BROMIDE 10 MG/ML (PF) SYRINGE
PREFILLED_SYRINGE | INTRAVENOUS | Status: DC | PRN
  Administered 2024-03-14: 30 mg via INTRAVENOUS
  Administered 2024-03-14: 20 mg via INTRAVENOUS
  Administered 2024-03-14: 10 mg via INTRAVENOUS
  Administered 2024-03-14: 50 mg via INTRAVENOUS

## 2024-03-14 MED ORDER — LIDOCAINE 2% (20 MG/ML) 5 ML SYRINGE
INTRAMUSCULAR | Status: DC | PRN
  Administered 2024-03-14: 80 mg via INTRAVENOUS

## 2024-03-14 MED ORDER — CHLORHEXIDINE GLUCONATE 0.12 % MT SOLN
15.0000 mL | Freq: Once | OROMUCOSAL | Status: AC
  Administered 2024-03-14: 15 mL via OROMUCOSAL
  Filled 2024-03-14: qty 15

## 2024-03-14 MED ORDER — LACTATED RINGERS IV SOLN: INTRAVENOUS | Status: DC

## 2024-03-14 MED ORDER — PROMETHAZINE HCL 25 MG PO TABS: 12.5000 mg | ORAL_TABLET | ORAL | Status: DC | PRN

## 2024-03-14 SURGICAL SUPPLY — 59 items
BAG COUNTER SPONGE SURGICOUNT (BAG) ×1 IMPLANT
BAND RUBBER #18 3X1/16 STRL (MISCELLANEOUS) ×2 IMPLANT
BLADE CLIPPER SURG (BLADE) IMPLANT
BLADE ULTRA TIP 2M (BLADE) IMPLANT
BUR ACORN 6.0 PRECISION (BURR) IMPLANT
CANISTER SUCTION 3000ML PPV (SUCTIONS) ×1 IMPLANT
CLIP TI MEDIUM 6 (CLIP) IMPLANT
COVER BACK TABLE 60X90IN (DRAPES) IMPLANT
DRAPE LAPAROTOMY 100X72 PEDS (DRAPES) IMPLANT
DRAPE MICROSCOPE SLANT 54X150 (MISCELLANEOUS) ×1 IMPLANT
DRAPE NEUROLOGICAL W/INCISE (DRAPES) ×1 IMPLANT
DRAPE WARM FLUID 44X44 (DRAPES) ×1 IMPLANT
DRSG OPSITE 4X5.5 SM (GAUZE/BANDAGES/DRESSINGS) IMPLANT
DURAPREP 26ML APPLICATOR (WOUND CARE) ×1 IMPLANT
ELECTRODE REM PT RTRN 9FT ADLT (ELECTROSURGICAL) ×1 IMPLANT
EVACUATOR 1/8 PVC DRAIN (DRAIN) IMPLANT
EVACUATOR SILICONE 100CC (DRAIN) IMPLANT
FELT TEFLON 1X6 (MISCELLANEOUS) IMPLANT
GAUZE 4X4 16PLY ~~LOC~~+RFID DBL (SPONGE) IMPLANT
GAUZE SPONGE 4X4 12PLY STRL (GAUZE/BANDAGES/DRESSINGS) IMPLANT
GLOVE BIOGEL PI IND STRL 7.0 (GLOVE) IMPLANT
GLOVE BIOGEL PI IND STRL 8.5 (GLOVE) ×1 IMPLANT
GLOVE ECLIPSE 8.5 STRL (GLOVE) ×1 IMPLANT
GLOVE EXAM NITRILE XL STR (GLOVE) IMPLANT
GOWN STRL REUS W/ TWL LRG LVL3 (GOWN DISPOSABLE) IMPLANT
GOWN STRL REUS W/ TWL XL LVL3 (GOWN DISPOSABLE) ×1 IMPLANT
GOWN STRL REUS W/TWL 2XL LVL3 (GOWN DISPOSABLE) ×1 IMPLANT
GRAFT DURAGEN MATRIX 1WX1L (Tissue) IMPLANT
HEMOSTAT POWDER KIT SURGIFOAM (HEMOSTASIS) IMPLANT
HEMOSTAT SURGICEL 2X14 (HEMOSTASIS) IMPLANT
KIT BASIN OR (CUSTOM PROCEDURE TRAY) ×1 IMPLANT
KIT TURNOVER KIT B (KITS) ×1 IMPLANT
NDL HYPO 22X1.5 SAFETY MO (MISCELLANEOUS) ×1 IMPLANT
NEEDLE HYPO 22X1.5 SAFETY MO (MISCELLANEOUS) ×1 IMPLANT
NS IRRIG 1000ML POUR BTL (IV SOLUTION) ×1 IMPLANT
PACK CRANIOTOMY CUSTOM (CUSTOM PROCEDURE TRAY) ×1 IMPLANT
PAD ARMBOARD POSITIONER FOAM (MISCELLANEOUS) ×3 IMPLANT
PATTIES SURGICAL .5 X.5 (GAUZE/BANDAGES/DRESSINGS) ×1 IMPLANT
PATTIES SURGICAL .5 X1 (DISPOSABLE) ×1 IMPLANT
PATTIES SURGICAL .5 X3 (DISPOSABLE) IMPLANT
PATTIES SURGICAL 1X1 (DISPOSABLE) ×1 IMPLANT
PIN MAYFIELD SKULL DISP (PIN) IMPLANT
SPIKE FLUID TRANSFER (MISCELLANEOUS) ×1 IMPLANT
SPONGE NEURO XRAY DETECT 1X3 (DISPOSABLE) IMPLANT
SPONGE SURGIFOAM ABS GEL 100 (HEMOSTASIS) IMPLANT
STAPLER SKIN PROX 35W (STAPLE) ×1 IMPLANT
SUT NURALON 4 0 TR CR/8 (SUTURE) ×2 IMPLANT
SUT SILK 2 0 TIES 10X30 (SUTURE) ×2 IMPLANT
SUT VIC AB 2-0 CP2 18 (SUTURE) ×2 IMPLANT
SUT VIC AB 2-0 CT1 18 (SUTURE) ×1 IMPLANT
SUT VIC AB 3-0 SH 8-18 (SUTURE) ×1 IMPLANT
SUT VIC AB 4-0 RB1 18 (SUTURE) ×1 IMPLANT
SYR 30ML SLIP (SYRINGE) ×1 IMPLANT
TOWEL GREEN STERILE (TOWEL DISPOSABLE) IMPLANT
TOWEL GREEN STERILE FF (TOWEL DISPOSABLE) IMPLANT
TRAY FOLEY MTR SLVR 16FR STAT (SET/KITS/TRAYS/PACK) IMPLANT
TUBING FEATHERFLOW (TUBING) ×1 IMPLANT
UNDERPAD 30X36 HEAVY ABSORB (UNDERPADS AND DIAPERS) IMPLANT
WATER STERILE IRR 1000ML POUR (IV SOLUTION) ×1 IMPLANT

## 2024-03-14 NOTE — Anesthesia Postprocedure Evaluation (Signed)
 Anesthesia Post Note  Patient: Bea Bottom  Procedure(s) Performed: LEFT MICROVASCULAR DECOMPRESSION FOR V2 TIC DOULOUREUX (Left)     Patient location during evaluation: PACU Anesthesia Type: General Level of consciousness: awake Pain management: pain level controlled Vital Signs Assessment: post-procedure vital signs reviewed and stable Respiratory status: spontaneous breathing, nonlabored ventilation and respiratory function stable Cardiovascular status: blood pressure returned to baseline and stable Postop Assessment: no apparent nausea or vomiting Anesthetic complications: no   No notable events documented.  Last Vitals:  Vitals:   03/14/24 1600 03/14/24 1615  BP: 110/64 116/73  Pulse: (!) 55 (!) 58  Resp: 12 11  Temp:    SpO2: 95% 97%    Last Pain:  Vitals:   03/14/24 1546  TempSrc:   PainSc: 8                  Conard Decent

## 2024-03-14 NOTE — H&P (Signed)
 Daniel Wells is an 63 y.o. male.   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  I had actually seen him about a year ago and we treated him with some peripheral analgesia in the infraorbital nerve region that gave him very transient relief he is also been on high doses of Tegretol  to help mitigate the pain and this has been helpful to some degree he was uninsured at the time and could not afford surgical intervention when I discussed the consideration of a suboccipital decompression done microvascular Merlyn Starring.  He is now able to afford such a procedure and is admitted to undergo surgical microvascular decompression of the V2 portion of the trigeminal nerve via the posterior fossa..  Past Medical History:  Diagnosis Date   History of kidney stones    Neuromuscular disorder (HCC)    Trigeminal Neuralgia   Viral hepatitis B without mention of hepatic coma, acute or unspecified, without mention of hepatitis delta 2005    Past Surgical History:  Procedure Laterality Date   COLONOSCOPY     TONSILLECTOMY AND ADENOIDECTOMY  1974   TYMPANOSTOMY TUBE PLACEMENT  1974    Family History  Adopted: Yes   Social History:  reports that he has been smoking cigarettes. He has a 46 pack-year smoking history. He has never used smokeless tobacco. He reports current drug use. Drug: Marijuana. He reports that he does not drink alcohol.  Allergies: No Known Allergies  No medications prior to admission.    No results found for this or any previous visit (from the past 48 hours). No results found.  Review of Systems  Constitutional: Negative.   HENT: Negative.    Eyes: Negative.   Respiratory: Negative.    Cardiovascular: Negative.   Gastrointestinal: Negative.   Endocrine: Negative.   Genitourinary: Negative.   Musculoskeletal:  Negative.   Neurological:        Facial pain in the V2 distribution.  Hematological: Negative.   Psychiatric/Behavioral: Negative.      There were no vitals taken for this visit. Physical Exam Constitutional:      Appearance: Normal appearance. He is normal weight.  HENT:     Head: Normocephalic and atraumatic.     Right Ear: Tympanic membrane, ear canal and external ear normal.     Left Ear: Tympanic membrane, ear canal and external ear normal.     Nose: Nose normal.     Mouth/Throat:     Mouth: Mucous membranes are moist.     Pharynx: Oropharynx is clear.  Eyes:     Extraocular Movements: Extraocular movements intact.     Conjunctiva/sclera: Conjunctivae normal.     Pupils: Pupils are equal, round, and reactive to light.  Cardiovascular:     Rate and Rhythm: Normal rate and regular rhythm.     Pulses: Normal pulses.     Heart sounds: Normal heart sounds.  Pulmonary:     Effort: Pulmonary effort is normal.     Breath sounds: Normal breath sounds.  Abdominal:     General: Abdomen is flat. Bowel sounds are normal.     Palpations: Abdomen is soft.  Musculoskeletal:        General: Normal range of motion.     Cervical back: Normal range of motion and neck supple.  Skin:    General: Skin is warm and dry.  Capillary Refill: Capillary refill takes less than 2 seconds.  Neurological:     General: No focal deficit present.     Mental Status: He is alert and oriented to person, place, and time.  Psychiatric:        Mood and Affect: Mood normal.        Behavior: Behavior normal.        Thought Content: Thought content normal.        Judgment: Judgment normal.      Assessment/Plan Left trigeminal neuralgia of the V2 distribution.  Plan microvascular decompression of the left trigeminal nerve.  Sela Daft, MD 03/14/2024, 7:50 AM

## 2024-03-14 NOTE — Interval H&P Note (Signed)
 History and Physical Interval Note:  03/14/2024 11:04 AM  Daniel Wells  has presented today for surgery, with the diagnosis of TRIGEMINAL NEURALGIA.  The various methods of treatment have been discussed with the patient and family. After consideration of risks, benefits and other options for treatment, the patient has consented to  Procedure(s) with comments: CRANIECTOMY FOR TIC DOULOUREUX (Left) - LEFT MICROVASCULAR DECOMPRESSION FOR V2 TIC DELOROUX as a surgical intervention.  The patient's history has been reviewed, patient examined, no change in status, stable for surgery.  I have reviewed the patient's chart and labs.  Questions were answered to the patient's satisfaction.     Sela Daft

## 2024-03-14 NOTE — Consult Note (Signed)
 NAME:  Daniel Wells, MRN:  [id number], DOB:  [birth year], LOS: 0 ADMISSION DATE:  03/14/2024, CONSULTATION DATE:  03/14/24 REFERRING MD:  Ellery Guthrie , CHIEF COMPLAINT:  pod 0 crani mvd v2 cn5   History of Present Illness:  63 yo M w L V2 TN who presented 5/15 for L sided suboccipital crani / MVD L V2 branch CN5. Uncomplicated case. To ICU post op as per pre-op plan.   PCCM is consulted in this  setting    Addl PMH - tobacco use, Hep B   Pertinent  Medical History  L V2 TN Tobacco use Hep B   Significant Hospital Events: Including procedures, antibiotic start and stop dates in addition to other pertinent events   Crani MVD. ICU post op   Interim History / Subjective:  Arrives to ICU POD0, extubated    Happy that he can say B and P without pain   Objective    Blood pressure 107/71, pulse (!) 59, temperature (!) 97.5 F (36.4 C), temperature source Oral, resp. rate 12, height 5\' 10"  (1.778 m), weight 62.6 kg, SpO2 97%.        Intake/Output Summary (Last 24 hours) at 03/14/2024 1653 Last data filed at 03/14/2024 1522 Gross per 24 hour  Intake 2500 ml  Output 170 ml  Net 2330 ml   Filed Weights   03/14/24 0840  Weight: 62.6 kg    Examination: General: chronically ill thin adult M NAD  HENT: L post op site. Some temporal muscle wasting  Lungs: CTAb on RA  Cardiovascular: rrr Abdomen: thin soft  Extremities: decr muscle mass  Neuro: awake, oriented but intermittently confused  GU: no foley   Resolved problem list   Assessment and Plan    L V2 TN s/p L suboccipital crani / mvd v2 branch CN5  P -post op per NSGY -will resume his home neuropathic pain meds  -PRN analgesia otherwise  -can have a diet in a little bit  Best Practice (right click and "Reselect all SmartList Selections" daily)   Diet/type: NPO DVT prophylaxis SCD Pressure ulcer(s): N/A GI prophylaxis: N/A Lines: N/A Foley:  N/A Code Status:  full code Last date of multidisciplinary goals of  care discussion [--]  Labs   CBC: No results for input(s): "WBC", "NEUTROABS", "HGB", "HCT", "MCV", "PLT" in the last 168 hours.  Basic Metabolic Panel: No results for input(s): "NA", "K", "CL", "CO2", "GLUCOSE", "BUN", "CREATININE", "CALCIUM", "MG", "PHOS" in the last 168 hours. GFR: Estimated Creatinine Clearance: 52.6 mL/min (by C-G formula based on SCr of 1.24 mg/dL). No results for input(s): "PROCALCITON", "WBC", "LATICACIDVEN" in the last 168 hours.  Liver Function Tests: No results for input(s): "AST", "ALT", "ALKPHOS", "BILITOT", "PROT", "ALBUMIN" in the last 168 hours. No results for input(s): "LIPASE", "AMYLASE" in the last 168 hours. No results for input(s): "AMMONIA" in the last 168 hours.  ABG    Component Value Date/Time   HCO3 23.2 04/13/2007 2157   TCO2 24 04/13/2007 2157     Coagulation Profile: No results for input(s): "INR", "PROTIME" in the last 168 hours.  Cardiac Enzymes: No results for input(s): "CKTOTAL", "CKMB", "CKMBINDEX", "TROPONINI" in the last 168 hours.  HbA1C: No results found for: "HGBA1C"  CBG: No results for input(s): "GLUCAP" in the last 168 hours.  Review of Systems:   Review of Systems  Constitutional:  Positive for diaphoresis.  Musculoskeletal:  Positive for back pain and myalgias.     Past Medical History:  He,  has a  past medical history of History of kidney stones, Neuromuscular disorder (HCC), and Viral hepatitis B without mention of hepatic coma, acute or unspecified, without mention of hepatitis delta (2005).   Surgical History:   Past Surgical History:  Procedure Laterality Date   COLONOSCOPY     TONSILLECTOMY AND ADENOIDECTOMY  1974   TYMPANOSTOMY TUBE PLACEMENT  1974     Social History:   reports that he has been smoking cigarettes. He has a 46 pack-year smoking history. He has never used smokeless tobacco. He reports current drug use. Drug: Marijuana. He reports that he does not drink alcohol.   Family  History:  His family history is not on file. He was adopted.   Allergies No Known Allergies   Home Medications  Prior to Admission medications   Medication Sig Start Date End Date Taking? Authorizing Provider  carbamazepine  (TEGRETOL ) 200 MG tablet Take 1 tablet (200 mg total) by mouth 4 (four) times daily. 08/23/23  Yes Gottschalk, Sharolyn Decant M, DO  gabapentin  (NEURONTIN ) 600 MG tablet Take 600 mg by mouth in the morning, at noon, in the evening, and at bedtime. 01/01/24  Yes [provider]     Critical care time: na        Eston Hence MSN, AGACNP-BC [facility] Pulmonary/Critical Care Medicine Amion for pager  03/14/2024, 5:11 PM

## 2024-03-14 NOTE — Transfer of Care (Signed)
 Immediate Anesthesia Transfer of Care Note  Patient: Daniel Wells  Procedure(s) Performed: LEFT MICROVASCULAR DECOMPRESSION FOR V2 TIC DOULOUREUX (Left)  Patient Location: PACU  Anesthesia Type:General  Level of Consciousness: awake, alert , and oriented  Airway & Oxygen Therapy: Patient Spontanous Breathing and Patient connected to face mask oxygen  Post-op Assessment: Report given to RN, Post -op Vital signs reviewed and stable, and Patient moving all extremities X 4  Post vital signs: Reviewed and stable  Last Vitals:  Vitals Value Taken Time  BP 144/78 03/14/24 1515  Temp    Pulse 53 03/14/24 1522  Resp 15 03/14/24 1522  SpO2 96 % 03/14/24 1522  Vitals shown include unfiled device data.  Last Pain:  Vitals:   03/14/24 1455  TempSrc:   PainSc: Asleep         Complications: No notable events documented.

## 2024-03-14 NOTE — Op Note (Signed)
 Date of surgery: 03/14/2024 Preoperative diagnosis: Left V2 trigeminal neuralgia Postoperative diagnosis: Same Procedure: Left microvascular decompression of the trigeminal nerve Surgeon: Elna Haggis Anesthesia: General Endotracheal Indications: Daniel Wells  is a 63 year old individual whose has had left V2 trigeminal neuralgia for over the years.  Time.  He has tried all manner of conservative treatment however the pain has not been responding well.  He is now taken to the operating room to undergo microvascular decompression of the left trigeminal neuralgia.  Procedure: Patient was brought to the operating supine on the stretcher.  After smooth induction of general endotracheal anesthesia he was placed in the 3 pin headrest with the patient then being placed in the park bench position is head was turned down to the right side.  Left suboccipital region was then shaved prepped with alcohol DuraPrep and draped in a sterile fashion.  A form inch incision was created behind the left ear just at the site of the mastoid.  The skin had been infiltrated with 10 cc of lidocaine with epinephrine mixed 50-50 with half percent Marcaine.  The cranium was identified and the dissection was carried down to expose the edge of the mastoid tip and the suboccipital region just at the curve of the skull.  When adequate bony dissection was performed a singular bur hole was created with a high-speed drill and the dura was identified.  Bony bleeding edges were controlled and the dissection was taken out to the lateral edge of the mastoid the mastoid send air cells were waxed.  The dissection was then carried out to the lateral edge where the sinus could be identified by changing color of the dura.  The upper corner of the sinus was identified.  Once this was exposed the craniectomy was noted to be about the size of a half dollar.  The dura was then opened in a cruciate fashion with the flaps being placed laterally and  superiorly.  The underlying cerebellum was then retracted and the lateral aspect of the arachnoid was opened to release cerebral spinal fluid.  This yielded good decompression of the cerebellum and is able to place a retractor on Applebaum retractor system to retract the cerebellum medially and from superior to inferior.  By gradually evacuating the spinal fluid the retractor could be moved further and ultimately identified the path of the 7th and 8th nerve laterally.  Then deep to this and slightly superior identified the path of the trigeminal nerve as it entered Meckel's cave.  There was noted to be a syncytial of arachnoid that was markedly thickened in this area small vessel around the sensation was cauterized and divided this was notably vein.  The dissection then continued circumferentially around the trunk of the 5th cranial nerve no vessels were noted to be in immediate proximity to the trigeminal nerve either dorsally or as best could be determined ventrally.  The large vein superior to this that was involved with the sensation arachnoid was carefully dissected and freed.  In the end there was no place to pad blood vessel away from the nerve itself however complete dissection of the nerve into Meckel's cave was completed and with this it was felt that the 5th cranial nerve was well decompressed.  The area was irrigated copiously and ultimately the retractors were removed the microscope was removed the path of the 7th and 8th nerve was intact and the dura was reapproximated over a piece of DuraGen with a singular 4-0 Nurolon suture.  The bony edges  were waxed carefully along with the mastoid air cells and some Surgifoam was left in the epidural space.  The retractor was removed and the deep fascia was closed with 2-0 Vicryl in interrupted fashion in 2 layers and the subcutaneous tissue was closed with 3-0 Vicryl in interrupted fashion surgical staples were placed in the scalp.  Blood loss for the procedure  was less than 50 cc.  Patient tolerated procedure well was returned to recovery room in stable condition after being removed from the 3 pin headrest.

## 2024-03-14 NOTE — Anesthesia Procedure Notes (Addendum)
 Procedure Name: Intubation Date/Time: 03/14/2024 12:09 PM  Performed by: Gabe Jock, CRNAPre-anesthesia Checklist: Patient identified, Emergency Drugs available, Suction available and Patient being monitored Patient Re-evaluated:Patient Re-evaluated prior to induction Oxygen Delivery Method: Circle System Utilized Preoxygenation: Pre-oxygenation with 100% oxygen Induction Type: IV induction Ventilation: Mask ventilation without difficulty Laryngoscope Size: Mac and 4 Grade View: Grade I Tube type: Oral Tube size: 7.5 mm Number of attempts: 1 Airway Equipment and Method: Stylet Placement Confirmation: ETT inserted through vocal cords under direct vision, positive ETCO2 and breath sounds checked- equal and bilateral Secured at: 23 cm Tube secured with: Tape Dental Injury: Teeth and Oropharynx as per pre-operative assessment  Comments: Intubation by Mardeen Shackleton, SRNA

## 2024-03-14 NOTE — Anesthesia Procedure Notes (Signed)
 Arterial Line Insertion Start/End5/15/2025 10:06 AM, 03/14/2024 10:06 AM Performed by: CRNA  Patient location: Pre-op. Preanesthetic checklist: patient identified, IV checked, site marked, risks and benefits discussed, surgical consent, monitors and equipment checked, pre-op evaluation, timeout performed and anesthesia consent Lidocaine 1% used for infiltration Left, radial was placed Catheter size: 20 G Hand hygiene performed  and maximum sterile barriers used   Attempts: 1 Procedure performed without using ultrasound guided technique. Following insertion, dressing applied and Biopatch. Post procedure assessment: normal  Patient tolerated the procedure well with no immediate complications.

## 2024-03-15 DIAGNOSIS — G500 Trigeminal neuralgia: Secondary | ICD-10-CM | POA: Diagnosis not present

## 2024-03-15 LAB — BASIC METABOLIC PANEL WITH GFR
Anion gap: 10 (ref 5–15)
BUN: 12 mg/dL (ref 8–23)
CO2: 18 mmol/L — ABNORMAL LOW (ref 22–32)
Calcium: 8.6 mg/dL — ABNORMAL LOW (ref 8.9–10.3)
Chloride: 109 mmol/L (ref 98–111)
Creatinine, Ser: 0.74 mg/dL (ref 0.61–1.24)
GFR, Estimated: >60 mL/min (ref >60)
Glucose, Bld: 101 mg/dL — ABNORMAL HIGH (ref 70–99)
Potassium: 3.7 mmol/L (ref 3.5–5.1)
Sodium: 137 mmol/L (ref 135–145)

## 2024-03-15 LAB — MAGNESIUM: Magnesium: 1.7 mg/dL (ref 1.7–2.4)

## 2024-03-15 MED ORDER — ACETAMINOPHEN 325 MG PO TABS
650.0000 mg | ORAL_TABLET | Freq: Four times a day (QID) | ORAL | Status: DC | PRN
  Administered 2024-03-15 – 2024-03-16 (×4): 650 mg via ORAL
  Filled 2024-03-15 (×4): qty 2

## 2024-03-15 MED ORDER — LACTATED RINGERS IV SOLN
INTRAVENOUS | Status: AC
Start: 2024-03-15 — End: 2024-03-16

## 2024-03-15 NOTE — TOC CM/SW Note (Signed)
 Transition of Care [facility]) - Inpatient Brief Assessment   Patient Details  Name: Daniel Wells MRN: [id number] Date of Birth: [birth year]  [facility] CM/SW Contact:    Jannice Mends, LCSW Phone Number: 03/15/2024, 9:38 AM   Clinical Narrative: Patient admitted from home for a craniectomy. No current TOC needs identified but please place consult if needs arise.     Transition of Care Asessment: Insurance and Status: Insurance coverage has been reviewed Patient has primary care physician: Yes Home environment has been reviewed: From home Prior level of function:: Independent Prior/Current Home Services: No current home services Social Drivers of Health Review: SDOH reviewed no interventions necessary Readmission risk has been reviewed: Yes Transition of care needs: no transition of care needs at this time

## 2024-03-15 NOTE — Consult Note (Signed)
   NAME:  Daniel Wells, MRN:  [id number], DOB:  29-Jul-1961, LOS: 1 ADMISSION DATE:  03/14/2024, CONSULTATION DATE:  03/14/24 REFERRING MD:  Ellery Guthrie , CHIEF COMPLAINT:  pod 0 crani mvd v2 cn5   History of Present Illness:  63 yo M w L V2 TN who presented 5/15 for L sided suboccipital crani / MVD L V2 branch CN5. Uncomplicated case. To ICU post op as per pre-op plan.   PCCM is consulted in this  setting    Addl PMH - tobacco use, Hep B   Pertinent  Medical History  L V2 TN Tobacco use Hep B   Significant Hospital Events: Including procedures, antibiotic start and stop dates in addition to other pertinent events   Crani MVD. ICU post op   Interim History / Subjective:   Complains of nausea  Objective    Blood pressure 110/63, pulse (!) 52, temperature 98.4 F (36.9 C), temperature source Oral, resp. rate 17, height 5\' 10"  (1.778 m), weight 70 kg, SpO2 96%.        Intake/Output Summary (Last 24 hours) at 03/15/2024 0856 Last data filed at 03/15/2024 0600 Gross per 24 hour  Intake 3603.82 ml  Output 170 ml  Net 3433.82 ml   Filed Weights   03/14/24 0840 03/14/24 1700  Weight: 62.6 kg 70 kg    Examination: Blood pressure 110/63, pulse (!) 52, temperature 98.4 F (36.9 C), temperature source Oral, resp. rate 17, height 5\' 10"  (1.778 m), weight 70 kg, SpO2 96%. Gen:      No acute distress HEENT:  EOMI, sclera anicteric Neck:     No masses; no thyromegaly Lungs:    Clear to auscultation bilaterally; normal respiratory effort CV:         Regular rate and rhythm; no murmurs Abd:      + bowel sounds; soft, non-tender; no palpable masses, no distension Ext:    No edema; adequate peripheral perfusion Neuro: alert and oriented x 3 Psych: normal mood and affect    Resolved problem list   Assessment and Plan  L V2 trigeminal neuralgia s/p L suboccipital crani and decompression of trigeminal P -post op per NSGY P.o. diet Continue Tegretol , Neurontin  per  neurosurgery Phenergan for nausea  Best Practice (right click and "Reselect all SmartList Selections" daily)   Diet/type: Regular consistency (see orders) DVT prophylaxis SCD Pressure ulcer(s): N/A GI prophylaxis: N/A Lines: N/A Foley:  N/A Code Status:  full code Last date of multidisciplinary goals of care discussion [--]  Critical care time: na   Phyllis Breeze MD [facility] See Amion for pager  If no response to pager , please call [phone] until 7pm After 7:00 pm call Elink  [phone] 03/15/2024, 8:57 AM

## 2024-03-15 NOTE — Progress Notes (Signed)
 Patient ID: Daniel Wells, male   DOB: [birth year], 63 y.o.   MRN: [id number] Vital signs are stable. Nausea continues to be a major problem Is likely from irritability in the area posttreatment secondary to the surgery.  This will resolve itself with time.  Continue supportive care and use of antiemetics as needed.  Will renew IV fluids to continue hydration.  Monitor in ICU for now.

## 2024-03-16 DIAGNOSIS — G500 Trigeminal neuralgia: Secondary | ICD-10-CM | POA: Diagnosis not present

## 2024-03-16 NOTE — Progress Notes (Signed)
 Pt d/c to home by car with friend. Assessment stable. D/C instructions reviewed and all questions answered.

## 2024-03-16 NOTE — Discharge Summary (Signed)
 Physician Discharge Summary  Patient ID: Daniel Wells MRN: [id number] DOB/AGE: Apr 04, 1961 63 y.o.  Admit date: 03/14/2024 Discharge date: 03/16/2024  Admission Diagnoses: Left V2 trigeminal neuralgia     Discharge Diagnoses: same   Discharged Condition: good  Hospital Course: The patient was admitted on 03/14/2024 and taken to the operating room where the patient underwent left decompression of the trigeminal nerve. The patient tolerated the procedure well and was taken to the recovery room and then to the ICU in stable condition. The hospital course was routine. There were no complications. The wound remained clean dry and intact. Pt had appropriate head soreness. No complaints of new pain or new N/T/W. The patient remained afebrile with stable vital signs, and tolerated a regular diet. The patient continued to increase activities, and pain was well controlled with oral pain medications.   Consults: None  Significant Diagnostic Studies:  Results for orders placed or performed during the hospital encounter of 03/14/24  ABO/Rh   Collection Time: 03/14/24  9:09 AM  Result Value Ref Range   ABO/RH(D)      O POS Performed at [facility], [address] [place], [place] [address]   MRSA Next Gen by PCR, Nasal   Collection Time: 03/14/24  5:15 PM   Specimen: Nasal Mucosa; Nasal Swab  Result Value Ref Range   MRSA by PCR Next Gen NOT DETECTED NOT DETECTED  Basic metabolic panel   Collection Time: 03/15/24  6:25 AM  Result Value Ref Range   Sodium 137 135 - 145 mmol/L   Potassium 3.7 3.5 - 5.1 mmol/L   Chloride 109 98 - 111 mmol/L   CO2 18 (L) 22 - 32 mmol/L   Glucose, Bld 101 (H) 70 - 99 mg/dL   BUN 12 8 - 23 mg/dL   Creatinine, Ser 1.91 0.61 - 1.24 mg/dL   Calcium 8.6 (L) 8.9 - 10.3 mg/dL   GFR, Estimated >47 >82 mL/min   Anion gap 10 5 - 15  Magnesium   Collection Time: 03/15/24  6:25 AM  Result Value Ref Range   Magnesium 1.7 1.7 - 2.4 mg/dL    No results  found.  Antibiotics:  Anti-infectives (From admission, onward)    Start     Dose/Rate Route Frequency Ordered Stop   03/14/24 0830  ceFAZolin  (ANCEF ) IVPB 2g/100 mL premix        2 g 200 mL/hr over 30 Minutes Intravenous On call to O.R. 03/14/24 0828 03/14/24 1231       Discharge Exam: Blood pressure 129/74, pulse 60, temperature 98 F (36.7 C), temperature source Oral, resp. rate 19, height 5\' 10"  (1.778 m), weight 70 kg, SpO2 100%. Neurologic: Grossly normal Ambulating and voiding well, incision cdi   Discharge Medications:   Allergies as of 03/16/2024   No Known Allergies      Medication List     TAKE these medications    carbamazepine  200 MG tablet Commonly known as: TEGretol  Take 1 tablet (200 mg total) by mouth 4 (four) times daily.   gabapentin  600 MG tablet Commonly known as: NEURONTIN  Take 600 mg by mouth in the morning, at noon, in the evening, and at bedtime.        Disposition: home   Final Dx: left decompression of the trigeminal nerve  Discharge Instructions     Ambulatory Referral for Lung Cancer Scre   Complete by: As directed    Call MD for:  difficulty breathing, headache or visual disturbances  Complete by: As directed    Call MD for:  hives   Complete by: As directed    Call MD for:  persistant dizziness or light-headedness   Complete by: As directed    Call MD for:  persistant nausea and vomiting   Complete by: As directed    Call MD for:  redness, tenderness, or signs of infection (pain, swelling, redness, odor or green/yellow discharge around incision site)   Complete by: As directed    Call MD for:  severe uncontrolled pain   Complete by: As directed    Call MD for:  temperature >100.4   Complete by: As directed    Diet - low sodium heart healthy   Complete by: As directed    Increase activity slowly   Complete by: As directed           Signed: Kenard Paul Austin Herd 03/16/2024, 10:16 AM

## 2024-03-16 NOTE — Progress Notes (Signed)
   NAME:  Daniel Wells, MRN:  [id number], DOB:  07-10-1961, LOS: 2 ADMISSION DATE:  03/14/2024, CONSULTATION DATE:  03/14/24 REFERRING MD:  Ellery Guthrie , CHIEF COMPLAINT:  pod 0 crani mvd v2 cn5   History of Present Illness:  63 yo M w L V2 TN who presented 5/15 for L sided suboccipital crani / MVD L V2 branch CN5. Uncomplicated case. To ICU post op as per pre-op plan.   PCCM is consulted in this  setting    Addl PMH - tobacco use, Hep B   Pertinent  Medical History  L V2 TN Tobacco use Hep B   Significant Hospital Events: Including procedures, antibiotic start and stop dates in addition to other pertinent events   Crani MVD. ICU post op   Interim History / Subjective:   No acute events overnight.  Objective    Blood pressure (!) 115/53, pulse 73, temperature 98 F (36.7 C), temperature source Oral, resp. rate 14, height 5\' 10"  (1.778 m), weight 70 kg, SpO2 94%.        Intake/Output Summary (Last 24 hours) at 03/16/2024 0805 Last data filed at 03/16/2024 0600 Gross per 24 hour  Intake 1049.33 ml  Output --  Net 1049.33 ml   Filed Weights   03/14/24 0840 03/14/24 1700  Weight: 62.6 kg 70 kg    Examination: Blood pressure (!) 115/53, pulse 73, temperature 98 F (36.7 C), temperature source Oral, resp. rate 14, height 5\' 10"  (1.778 m), weight 70 kg, SpO2 94%. 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 diet Continue Tegretol , Neurontin   per neurosurgery Phenergan  for nausea  Plan for discharge today by primary team.  Best Practice (right click and "Reselect all SmartList Selections" daily)   Diet/type: Regular consistency (see orders) DVT prophylaxis SCD Pressure ulcer(s): N/A GI prophylaxis: N/A Lines: N/A Foley:  N/A Code Status:  full code Last date of multidisciplinary goals of care discussion [--]  Critical care time: na   Phyllis Breeze MD [facility] See Amion for pager  If no response to pager , please call [phone] until 7pm After 7:00 pm call Elink  [phone] 03/16/2024, 8:05 AM

## 2024-03-16 NOTE — Plan of Care (Signed)
  Problem: Education: Goal: Knowledge of General Education information will improve Description: Including pain rating scale, medication(s)/side effects and non-pharmacologic comfort measures Outcome: Progressing   Problem: Clinical Measurements: Goal: Ability to maintain clinical measurements within normal limits will improve Outcome: Progressing Goal: Cardiovascular complication will be avoided Outcome: Progressing   Problem: Clinical Measurements: Goal: Cardiovascular complication will be avoided Outcome: Progressing   Problem: Activity: Goal: Risk for activity intolerance will decrease Outcome: Progressing   Problem: Coping: Goal: Level of anxiety will decrease Outcome: Progressing   Problem: Clinical Measurements: Goal: Usual level of consciousness will be regained or maintained. Outcome: Progressing Goal: Neurologic status will improve Outcome: Progressing   Problem: Clinical Measurements: Goal: Neurologic status will improve Outcome: Progressing

## 2024-03-18 ENCOUNTER — Encounter (HOSPITAL_COMMUNITY): Payer: Self-pay | Admitting: Neurological Surgery

## 2024-04-17 ENCOUNTER — Telehealth: Payer: Self-pay

## 2024-04-17 DIAGNOSIS — Z87891 Personal history of nicotine dependence: Secondary | ICD-10-CM

## 2024-04-17 DIAGNOSIS — Z122 Encounter for screening for malignant neoplasm of respiratory organs: Secondary | ICD-10-CM

## 2024-04-17 DIAGNOSIS — F17210 Nicotine dependence, cigarettes, uncomplicated: Secondary | ICD-10-CM

## 2024-04-17 NOTE — Telephone Encounter (Signed)
.  Lung Cancer Screening Narrative/Criteria Questionnaire (Cigarette Smokers Only- No Cigars/Pipes/vapes)   Daniel Wells   SDMV:04/29/2024 at 10:45 am with [place]                                  LDCT: 04/29/2024 at 12:45 pm at GI   63 y.o.   Phone: [phone]  Lung Screening Narrative (confirm age 50-77 yrs Medicare / 50-80 yrs Private pay insurance)   Insurance information: [facility] Medicare   Referring Provider: Referral sent during admission.    This screening involves an initial phone call with a team member from our program. It is called a shared decision making visit. The initial meeting is required by  insurance and Medicare to make sure you understand the program. This appointment takes about 15-20 minutes to complete. You will complete the screening scan at your scheduled date/time.  This scan takes about 5-10 minutes to complete. You can eat and drink normally before and after the scan.  Criteria questions for Lung Cancer Screening:   Are you a current or former smoker? Current Age began smoking: 72   If you are a former smoker, what year did you quit smoking? (within 15 yrs)   To calculate your smoking history, I need an accurate estimate of how many packs of cigarettes you smoked per day and for how many years. (Not just the number of PPD you are now smoking)   Years smoking 49 x Packs per day 3 = Pack years 147   (at least 20 pack yrs)   (Make sure they understand that we need to know how much they have smoked in the past, not just the number of PPD they are smoking now)  Do you have a personal history of cancer?  No    Do you have a family history of cancer? No  Are you coughing up blood?  No  Have you had unexplained weight loss of 15 lbs or more in the last 6 months? No  It looks like you meet all criteria.  When would be a good time for us  to schedule you for this screening?   Additional information: N/A

## 2024-04-29 ENCOUNTER — Ambulatory Visit: Admitting: Adult Health

## 2024-04-29 ENCOUNTER — Ambulatory Visit
Admission: RE | Admit: 2024-04-29 | Discharge: 2024-04-29 | Disposition: A | Discharge status: Home / Self Care | Source: Ambulatory Visit | Attending: Acute Care | Admitting: Acute Care

## 2024-04-29 ENCOUNTER — Encounter: Payer: Self-pay | Admitting: Adult Health

## 2024-04-29 DIAGNOSIS — Z122 Encounter for screening for malignant neoplasm of respiratory organs: Secondary | ICD-10-CM

## 2024-04-29 DIAGNOSIS — Z87891 Personal history of nicotine dependence: Secondary | ICD-10-CM

## 2024-04-29 DIAGNOSIS — F17210 Nicotine dependence, cigarettes, uncomplicated: Secondary | ICD-10-CM

## 2024-04-29 NOTE — Progress Notes (Signed)
  Virtual Visit via Telephone Note  I connected with Daniel Wells , 04/29/24 10:52 AM by a telemedicine application and verified that I am speaking with the correct person using two identifiers.  Location: Patient: home Provider: home   I discussed the limitations of evaluation and management by telemedicine and the availability of in person appointments. The patient expressed understanding and agreed to proceed.   Shared Decision Making Visit Lung Cancer Screening Program [phone])   Eligibility: 63 y.o. Pack Years Smoking History Calculation = 147 pack years  (# packs/per year x # years smoked) Recent History of coughing up blood  no Unexplained weight loss? no ( >Than 15 pounds within the last 6 months ) Prior History Lung / other cancer no (Diagnosis within the last 5 years already requiring surveillance chest CT Scans). Smoking Status Current Smoker   Visit Components: Discussion included one or more decision making aids. YES Discussion included risk/benefits of screening. YES Discussion included potential follow up diagnostic testing for abnormal scans. YES Discussion included meaning and risk of over diagnosis. YES Discussion included meaning and risk of False Positives. YES Discussion included meaning of total radiation exposure. YES  Counseling Included: Importance of adherence to annual lung cancer LDCT screening. YES Impact of comorbidities on ability to participate in the program. YES Ability and willingness to under diagnostic treatment. YES  Smoking Cessation Counseling: Current Smokers:  Discussed importance of smoking cessation. yes Information about tobacco cessation classes and interventions provided to patient. yes Patient provided with ticket for LDCT Scan. yes Symptomatic Patient. NO Diagnosis Code: Tobacco Use Z72.0 Asymptomatic Patient yes  Counseling - 4 minutes of smoking cessation counseling  (CT Chest Lung Cancer Screening Low Dose W/O  CM) [id number]  Z12.2-Screening of respiratory organs Z87.891-Personal history of nicotine  dependence   Daniel Wells 04/29/24

## 2024-04-29 NOTE — Patient Instructions (Signed)

## 2024-05-08 ENCOUNTER — Other Ambulatory Visit: Payer: Self-pay

## 2024-05-08 DIAGNOSIS — Z87891 Personal history of nicotine dependence: Secondary | ICD-10-CM

## 2024-05-08 DIAGNOSIS — Z122 Encounter for screening for malignant neoplasm of respiratory organs: Secondary | ICD-10-CM

## 2024-05-08 DIAGNOSIS — F17210 Nicotine dependence, cigarettes, uncomplicated: Secondary | ICD-10-CM

## 2024-11-12 ENCOUNTER — Encounter: Payer: Self-pay | Admitting: Family Medicine

## 2024-11-12 ENCOUNTER — Ambulatory Visit (INDEPENDENT_AMBULATORY_CARE_PROVIDER_SITE_OTHER): Admitting: Family Medicine

## 2024-11-12 VITALS — BP 127/76 | HR 70 | Temp 97.5°F | Ht 70.0 in | Wt 146.5 lb

## 2024-11-12 DIAGNOSIS — Z125 Encounter for screening for malignant neoplasm of prostate: Secondary | ICD-10-CM | POA: Diagnosis not present

## 2024-11-12 DIAGNOSIS — L209 Atopic dermatitis, unspecified: Secondary | ICD-10-CM | POA: Diagnosis not present

## 2024-11-12 DIAGNOSIS — F17200 Nicotine dependence, unspecified, uncomplicated: Secondary | ICD-10-CM

## 2024-11-12 DIAGNOSIS — Z1211 Encounter for screening for malignant neoplasm of colon: Secondary | ICD-10-CM

## 2024-11-12 DIAGNOSIS — Z9889 Other specified postprocedural states: Secondary | ICD-10-CM | POA: Diagnosis not present

## 2024-11-12 DIAGNOSIS — E7800 Pure hypercholesterolemia, unspecified: Secondary | ICD-10-CM

## 2024-11-12 DIAGNOSIS — G479 Sleep disorder, unspecified: Secondary | ICD-10-CM

## 2024-11-12 LAB — LIPID PANEL

## 2024-11-12 MED ORDER — TRIAMCINOLONE ACETONIDE 0.025 % EX OINT: TOPICAL_OINTMENT | Freq: Two times a day (BID) | CUTANEOUS | 0 refills | Status: AC | PRN

## 2024-11-12 NOTE — Patient Instructions (Signed)
Basic Skin Care Your skin plays an important role in keeping the entire body healthy.  Below are some tips on how to try and maximize skin health from the outside in.  Bathe in mildly warm water every 1 to 3 days, followed by light drying and an application of a thick moisturizer cream or ointment, preferably one that comes in a tub. Fragrance free moisturizing bars or body washes are preferred such as Purpose, Cetaphil, Dove sensitive skin, Aveeno, [place] Baby or Vanicream products. Use a fragrance free cream or ointment, not a lotion, such as plain petroleum jelly or Vaseline ointment, Aquaphor, Vanicream, Eucerin cream or a generic version, CeraVe Cream, Cetaphil Restoraderm, Aveeno Eczema Therapy and [place] Baby Calming, among others. Children with very dry skin often need to put on these creams two, three or four times a day.  As much as possible, use these creams enough to keep the skin from looking dry. Consider using fragrance free/dye free detergent, such as Arm and Hammer for sensitive skin, Tide Free or All Free.   If I am prescribing a medication to go on the skin, the medicine goes on first to the areas that need it, followed by a thick cream as above to the entire body.  Sun is a major cause of damage to the skin. I recommend sun protection for all of my patients. I prefer physical barriers such as hats with wide brims that cover the ears, long sleeve clothing with SPF protection including rash guards for swimming. These can be found seasonally at outdoor clothing companies, [employer] and [employer] and online at www.coolibar.com, www.uvskinz.com and www.sunprecautions.com. Avoid peak sun between the hours of 10am to 3pm to minimize sun exposure.  I recommend sunscreen for all of my patients older than 6 months of age when in the sun, preferably with broad spectrum coverage and SPF 30 or higher.  For children, I recommend sunscreens that only contain titanium dioxide and/or zinc oxide  in the active ingredients. These do not burn the eyes and appear to be safer than chemical sunscreens. These sunscreens include zinc oxide paste found in the diaper section, Vanicream Broad Spectrum 50+, Aveeno Natural Mineral Protection, Neutrogena Pure and Free Baby, Johnson and Johnson Baby Daily face and body lotion, [place] Baby products, among others. There is no such thing as waterproof sunscreen. All sunscreens should be reapplied after 60-80 minutes of wear.  Spray on sunscreens often use chemical sunscreens which do protect against the sun. However, these can be difficult to apply correctly, especially if wind is present, and can be more likely to irritate the skin.  Long term effects of chemical sunscreens are also not fully known.     

## 2024-11-12 NOTE — Progress Notes (Signed)
 "  Subjective: CC: Overdue for routine follow-up PCP: Jolinda Norene HERO, DO YEP:Upfnuyb Daniel Wells is a 64 y.o. male presenting to clinic today for:  Patient presents today but he is not quite sure why.  He notes that he was simply called and was told that he was overdue for follow-up and they recommended an appointment.  Last seen in October 2024.  At that time he was struggling with some left sided trigeminal neuralgia and has since then undergone craniotomy with Dr. Colon.  He reports total resolution of the pain he was experiencing on the left side of his face.  He continues to smoke 1 pack/day and has done so for over 20 years with 2 pack/day history for 20 years and 3 pack/day history for 10 years.  He denies any changes in exercise tolerance, no difficulty swallowing, no chest pain, shortness of breath.  No hemoptysis.  No unplanned weight loss except when he was sick with trigeminal neuralgia because he could not chew well.  He since has gained back almost all of his weight and is about 5 pounds shy of his baseline weight.  He does not know family history as he is adopted but is interested in getting lipoprotein a checked to determine cardiovascular risk.  He denies any rectal bleeding.  Amenable to Cologuard but does not want to undergo colonoscopy again.  He also reports dermatitis of bilateral hands.  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  He does not report any moisturizing regimen but does report dry, cracked and fissured hands at some times.  He has reports sleep disturbances around 3 AM.  He is asleep for about 4 hours prior to waking up.  Denies any need to urinate or any specific triggers.  He has not utilize anything over-the-counter because he does not like taking pills.  He does eat a very well-balanced diet and has reduced his caffeine and only drinks it in the morning time.   ROS: Per HPI  Allergies[1] Past Medical History:  Diagnosis Date   History of kidney stones     Neuromuscular disorder (HCC)    Trigeminal Neuralgia   Viral hepatitis B without mention of hepatic coma, acute or unspecified, without mention of hepatitis delta 2005   Current Medications[2] Social History   Socioeconomic History   Marital status: Single    Spouse name: Not on file   Number of children: Not on file   Years of education: Not on file   Highest education level: Not on file  Occupational History   Not on file  Tobacco Use   Smoking status: Every Day    Current packs/day: 1.00    Average packs/day: 1 pack/day for 46.0 years (46.0 ttl pk-yrs)    Types: Cigarettes   Smokeless tobacco: Never  Vaping Use   Vaping status: Never Used  Substance and Sexual Activity   Alcohol use: No   Drug use: Yes    Types: Marijuana    Comment: about 2 hits per day   Sexual activity: Not Currently  Other Topics Concern   Not on file  Social History Narrative   Not on file   Social Drivers of Health   Tobacco Use: High Risk (04/29/2024)   Patient History    Smoking Tobacco Use: Every Day    Smokeless Tobacco Use: Never    Passive Exposure: Not on file  Financial Resource Strain: Not on file  Food Insecurity: Not on file  Transportation Needs: Not on  file  Physical Activity: Not on file  Stress: Not on file  Social Connections: Not on file  Intimate Partner Violence: Not on file  Depression [phone]): Low Risk (08/22/2023)   Depression (PHQ2-9)    PHQ-2 Score: 0  Alcohol Screen: Not on file  Housing: Not on file  Utilities: Not on file  Health Literacy: Not on file   Family History  Adopted: Yes    Objective: Office vital signs reviewed. BP 127/76   Pulse 70   Temp (!) 97.5 F (36.4 C)   Ht 5' 10 (1.778 m)   Wt 146 lb 8 oz (66.5 kg)   SpO2 98%   BMI 21.02 kg/m   Physical Examination:  General: Awake, alert, well nourished, No acute distress HEENT: Sclera white.  Moist mucous membranes. Cardio: regular rate and rhythm, S1S2 heard, no murmurs  appreciated Pulm: Coarse breath sounds throughout but no wheezes, rhonchi or rales appreciated.  He has normal work of breathing on room air with no dyspnea on exertion or speech MSK: Ambulating independently with normal gait and station.  Normal tone Skin: Some fissuring of the hand, particularly on the palms left greater than right.  Skin of hands is thickened Psych: Pleasant, interactive.  Somewhat hyper     11/12/2024    8:40 AM 08/22/2023    9:50 AM 08/30/2021   11:15 AM  Depression screen PHQ 2/9  Decreased Interest 0 0 0  Down, Depressed, Hopeless 0 0 0  PHQ - 2 Score 0 0 0  Altered sleeping 0 0   Tired, decreased energy 0 0   Change in appetite 0 0   Feeling bad or failure about yourself  0 0   Trouble concentrating 0 0   Moving slowly or fidgety/restless 0 0   Suicidal thoughts 0 0   PHQ-9 Score 0 0    Difficult doing work/chores Not difficult at all Not difficult at all      Data saved with a previous flowsheet row definition      11/12/2024    8:40 AM 08/22/2023    9:50 AM 08/30/2021   11:15 AM  GAD 7 : Generalized Anxiety Score  Nervous, Anxious, on Edge 0 0 0  Control/stop worrying 0 0 0  Worry too much - different things 0 0 0  Trouble relaxing 0 0 0  Restless 0 0 0  Easily annoyed or irritable 0 0 0  Afraid - awful might happen 0 0 0  Total GAD 7 Score 0 0 0  Anxiety Difficulty Not difficult at all Not difficult at all Not difficult at all    Assessment/ Plan: 64 y.o. male   Pure hypercholesterolemia - Plan: CMP14+EGFR, Lipid Panel, Lipoprotein A (LPA)  TOBACCO USE - Plan: CBC with Differential, Ambulatory Referral Lung Cancer Screening  Pulmonary  Screening for malignant neoplasm of prostate - Plan: PSA  History of craniotomy - Plan: CMP14+EGFR, CBC with Differential  Chronic atopic dermatitis of hand - Plan: triamcinolone  oint 0.025%-Cerave equivalent cream 1:1 mixture  Screen for colon cancer - Plan: Cologuard  Sleep disturbances    Fasting labs collected.  Will add lipoprotein a.  We discussed of course that tobacco Evone is the greatest risk to cardiovascular health but he is precontemplative on smoking cessation.  I referred him for lung cancer screening as he was amenable to this.  Will check CBC.  Screening PSA collected.  Asymptomatic  Triamcinolone  combined with CeraVe compound sent for as needed use.  Handout for  proper skin care also provided.  Encouraged use of gloves.  Cologuard ordered.  No red flag signs or symptoms.  Unknown family history.  With regards to sleep disturbances, could consider sleep referral but discussed homeopathic remedies to encourage sleep including consumption of melatonin rich foods and/or teas that would relax prior to bed.  Restrict caffeine intake.  Norene CHRISTELLA Fielding, DO [facility] [phone]     [1] No Known Allergies [2]  Current Outpatient Medications:    carbamazepine  (TEGRETOL ) 200 MG tablet, Take 1 tablet (200 mg total) by mouth 4 (four) times daily., Disp: 360 tablet, Rfl: 3   gabapentin  (NEURONTIN ) 600 MG tablet, Take 600 mg by mouth in the morning, at noon, in the evening, and at bedtime., Disp: , Rfl:   "

## 2024-11-13 LAB — LIPID PANEL
Cholesterol, Total: 182 mg/dL (ref 100–199)
HDL: 48 mg/dL
LDL CALC COMMENT:: 3.8 ratio (ref 0.0–5.0)
LDL Chol Calc (NIH): 117 mg/dL — AB (ref 0–99)
Triglycerides: 90 mg/dL (ref 0–149)
VLDL Cholesterol Cal: 17 mg/dL (ref 5–40)

## 2024-11-13 LAB — CBC WITH DIFFERENTIAL/PLATELET
Basophils Absolute: 0.1 x10E3/uL (ref 0.0–0.2)
Basos: 1 %
EOS (ABSOLUTE): 0.2 x10E3/uL (ref 0.0–0.4)
Eos: 2 %
Hematocrit: 52.5 % — ABNORMAL HIGH (ref 37.5–51.0)
Hemoglobin: 17.3 g/dL (ref 13.0–17.7)
Immature Grans (Abs): 0 x10E3/uL (ref 0.0–0.1)
Immature Granulocytes: 0 %
Lymphocytes Absolute: 2.1 x10E3/uL (ref 0.7–3.1)
Lymphs: 26 %
MCH: 31.9 pg (ref 26.6–33.0)
MCHC: 33 g/dL (ref 31.5–35.7)
MCV: 97 fL (ref 79–97)
Monocytes Absolute: 0.6 x10E3/uL (ref 0.1–0.9)
Monocytes: 8 %
Neutrophils Absolute: 5.1 x10E3/uL (ref 1.4–7.0)
Neutrophils: 63 %
Platelets: 220 x10E3/uL (ref 150–450)
RBC: 5.42 x10E6/uL (ref 4.14–5.80)
RDW: 12.6 % (ref 11.6–15.4)
WBC: 8 x10E3/uL (ref 3.4–10.8)

## 2024-11-13 LAB — CMP14+EGFR
ALT: 26 IU/L (ref 0–44)
AST: 25 IU/L (ref 0–40)
Albumin: 4.5 g/dL (ref 3.9–4.9)
Alkaline Phosphatase: 145 IU/L — AB (ref 47–123)
BUN/Creatinine Ratio: 16 (ref 10–24)
BUN: 16 mg/dL (ref 8–27)
Bilirubin Total: 0.8 mg/dL (ref 0.0–1.2)
CO2: 24 mmol/L (ref 20–29)
Calcium: 10.7 mg/dL — AB (ref 8.6–10.2)
Chloride: 105 mmol/L (ref 96–106)
Creatinine, Ser: 0.99 mg/dL (ref 0.76–1.27)
Globulin, Total: 2.2 g/dL (ref 1.5–4.5)
Glucose: 90 mg/dL (ref 70–99)
Potassium: 4.7 mmol/L (ref 3.5–5.2)
Sodium: 146 mmol/L — AB (ref 134–144)
Total Protein: 6.7 g/dL (ref 6.0–8.5)
eGFR: 85 mL/min/1.73

## 2024-11-13 LAB — LIPOPROTEIN A (LPA): Lipoprotein (a): 118.7 nmol/L — AB

## 2024-11-13 LAB — PSA: Prostate Specific Ag, Serum: 0.5 ng/mL (ref 0.0–4.0)

## 2024-11-15 ENCOUNTER — Ambulatory Visit: Payer: Self-pay | Admitting: Family Medicine

## 2024-11-25 LAB — COLOGUARD: COLOGUARD: NEGATIVE

## 2025-11-12 ENCOUNTER — Encounter: Admitting: Family Medicine
